# Patient Record
Sex: Female | Born: 1980 | Race: Black or African American | Hispanic: No | Marital: Married | Smoking: Never smoker
Health system: Southern US, Community
[De-identification: ages and names within clinical notes are randomized; demographics above are authoritative.]

## PROBLEM LIST (undated history)

## (undated) ENCOUNTER — Inpatient Hospital Stay (HOSPITAL_COMMUNITY): Payer: Self-pay

## (undated) DIAGNOSIS — D649 Anemia, unspecified: Secondary | ICD-10-CM

## (undated) DIAGNOSIS — K219 Gastro-esophageal reflux disease without esophagitis: Secondary | ICD-10-CM

## (undated) DIAGNOSIS — T7840XA Allergy, unspecified, initial encounter: Secondary | ICD-10-CM

## (undated) DIAGNOSIS — R51 Headache: Secondary | ICD-10-CM

## (undated) DIAGNOSIS — R011 Cardiac murmur, unspecified: Secondary | ICD-10-CM

## (undated) HISTORY — DX: Allergy, unspecified, initial encounter: T78.40XA

## (undated) HISTORY — DX: Gastro-esophageal reflux disease without esophagitis: K21.9

---

## 2004-12-29 ENCOUNTER — Inpatient Hospital Stay (HOSPITAL_COMMUNITY): Admission: AD | Admit: 2004-12-29 | Discharge: 2004-12-29 | Payer: Self-pay | Admitting: Family Medicine

## 2004-12-29 ENCOUNTER — Ambulatory Visit: Payer: Self-pay | Admitting: Family Medicine

## 2005-01-09 ENCOUNTER — Inpatient Hospital Stay (HOSPITAL_COMMUNITY): Admission: AD | Admit: 2005-01-09 | Discharge: 2005-01-09 | Payer: Self-pay | Admitting: Obstetrics & Gynecology

## 2005-01-09 ENCOUNTER — Ambulatory Visit: Payer: Self-pay | Admitting: *Deleted

## 2005-01-14 ENCOUNTER — Ambulatory Visit: Payer: Self-pay | Admitting: *Deleted

## 2005-01-20 ENCOUNTER — Ambulatory Visit (HOSPITAL_COMMUNITY): Admission: RE | Admit: 2005-01-20 | Discharge: 2005-01-20 | Payer: Self-pay | Admitting: Obstetrics & Gynecology

## 2005-01-23 ENCOUNTER — Inpatient Hospital Stay (HOSPITAL_COMMUNITY): Admission: AD | Admit: 2005-01-23 | Discharge: 2005-01-23 | Payer: Self-pay | Admitting: Obstetrics & Gynecology

## 2005-01-28 ENCOUNTER — Ambulatory Visit: Payer: Self-pay | Admitting: Family Medicine

## 2005-02-03 ENCOUNTER — Ambulatory Visit (HOSPITAL_COMMUNITY): Admission: RE | Admit: 2005-02-03 | Discharge: 2005-02-03 | Payer: Self-pay | Admitting: Obstetrics & Gynecology

## 2005-02-18 ENCOUNTER — Ambulatory Visit: Payer: Self-pay | Admitting: *Deleted

## 2005-02-25 ENCOUNTER — Inpatient Hospital Stay (HOSPITAL_COMMUNITY): Admission: AD | Admit: 2005-02-25 | Discharge: 2005-02-25 | Payer: Self-pay | Admitting: *Deleted

## 2005-02-27 ENCOUNTER — Ambulatory Visit: Payer: Self-pay | Admitting: *Deleted

## 2005-02-27 ENCOUNTER — Inpatient Hospital Stay (HOSPITAL_COMMUNITY): Admission: RE | Admit: 2005-02-27 | Discharge: 2005-03-02 | Payer: Self-pay | Admitting: Obstetrics & Gynecology

## 2005-11-19 ENCOUNTER — Ambulatory Visit: Payer: Self-pay | Admitting: Obstetrics & Gynecology

## 2006-02-05 ENCOUNTER — Ambulatory Visit: Payer: Self-pay | Admitting: Family Medicine

## 2006-04-29 ENCOUNTER — Ambulatory Visit: Payer: Self-pay | Admitting: Gynecology

## 2006-07-17 ENCOUNTER — Ambulatory Visit: Payer: Self-pay | Admitting: Obstetrics & Gynecology

## 2006-10-06 ENCOUNTER — Ambulatory Visit: Payer: Self-pay | Admitting: Obstetrics & Gynecology

## 2007-04-15 ENCOUNTER — Ambulatory Visit: Payer: Self-pay | Admitting: Obstetrics & Gynecology

## 2007-04-15 ENCOUNTER — Encounter: Payer: Self-pay | Admitting: Obstetrics & Gynecology

## 2007-09-30 ENCOUNTER — Ambulatory Visit: Payer: Self-pay | Admitting: *Deleted

## 2007-11-10 ENCOUNTER — Ambulatory Visit: Payer: Self-pay | Admitting: Obstetrics & Gynecology

## 2007-11-12 ENCOUNTER — Ambulatory Visit (HOSPITAL_COMMUNITY): Admission: RE | Admit: 2007-11-12 | Discharge: 2007-11-12 | Payer: Self-pay | Admitting: Gynecology

## 2008-01-14 ENCOUNTER — Emergency Department (HOSPITAL_COMMUNITY): Admission: EM | Admit: 2008-01-14 | Discharge: 2008-01-14 | Payer: Self-pay | Admitting: Family Medicine

## 2008-04-12 ENCOUNTER — Emergency Department (HOSPITAL_COMMUNITY): Admission: EM | Admit: 2008-04-12 | Discharge: 2008-04-12 | Payer: Self-pay | Admitting: Emergency Medicine

## 2008-10-10 ENCOUNTER — Emergency Department (HOSPITAL_COMMUNITY): Admission: EM | Admit: 2008-10-10 | Discharge: 2008-10-10 | Payer: Self-pay | Admitting: Emergency Medicine

## 2009-04-16 ENCOUNTER — Emergency Department (HOSPITAL_COMMUNITY): Admission: EM | Admit: 2009-04-16 | Discharge: 2009-04-17 | Payer: Self-pay | Admitting: Emergency Medicine

## 2009-06-08 ENCOUNTER — Encounter: Payer: Self-pay | Admitting: *Deleted

## 2009-06-14 ENCOUNTER — Encounter (INDEPENDENT_AMBULATORY_CARE_PROVIDER_SITE_OTHER): Payer: Self-pay | Admitting: *Deleted

## 2009-07-04 ENCOUNTER — Encounter: Payer: Self-pay | Admitting: *Deleted

## 2009-09-23 ENCOUNTER — Emergency Department (HOSPITAL_COMMUNITY): Admission: EM | Admit: 2009-09-23 | Discharge: 2009-09-23 | Payer: Self-pay | Admitting: Family Medicine

## 2010-01-11 ENCOUNTER — Telehealth: Payer: Self-pay | Admitting: *Deleted

## 2010-02-18 ENCOUNTER — Ambulatory Visit: Payer: Self-pay | Admitting: Obstetrics & Gynecology

## 2010-03-07 ENCOUNTER — Ambulatory Visit
Admission: RE | Admit: 2010-03-07 | Discharge: 2010-03-07 | Payer: Self-pay | Source: Home / Self Care | Attending: Obstetrics & Gynecology | Admitting: Obstetrics & Gynecology

## 2010-03-14 ENCOUNTER — Ambulatory Visit (HOSPITAL_COMMUNITY)
Admission: RE | Admit: 2010-03-14 | Discharge: 2010-03-14 | Payer: Self-pay | Source: Home / Self Care | Attending: Obstetrics and Gynecology | Admitting: Obstetrics and Gynecology

## 2010-03-22 ENCOUNTER — Ambulatory Visit: Admit: 2010-03-22 | Payer: Self-pay | Admitting: Obstetrics and Gynecology

## 2010-04-09 NOTE — Miscellaneous (Signed)
Summary: Not allowed to reschedule  Pt aware of appt today but still did not show.  Is not allowed to reschedule.  We will continue to see her children but she will need to find another PCP.  Dennison Nancy RN  July 04, 2009 4:09 PM

## 2010-04-09 NOTE — Miscellaneous (Signed)
Summary: Do Not Reschedule!  Pt missed NP appt.  Per [facility] policy is not allowed to reschedule.  Dennison Nancy RN  June 08, 2009 11:32 AM

## 2010-04-09 NOTE — Progress Notes (Signed)
Summary: phn msg  Phone Note Call from Patient Call back at Home Phone [phone]   Caller: Patient Summary of Call: pt is calling back to see if she can get an appt - pt states that she talked with someone about a month ago and was called back stating that she can make an appt - she is calling now b/c there was a death in her family.  told her that I would need to talk w/ supervisor Initial call taken by: De Nurse,  January 11, 2010 10:19 AM  Follow-up for Phone Call        I think I discussed this patient with Britta Mccreedy.  Told her I would give her another chance since her children are seen here.  If she no shows another NP appt she will not be allowed another chance. Follow-up by: Dennison Nancy RN,  January 13, 2010 8:56 PM

## 2010-04-09 NOTE — Miscellaneous (Signed)
Summary: Can schedule NP appt  Pt left a vm on 06/05/09 stating she needed to cancel her appt but she would call to reschedule, vm was not checked before her appt was noshowed, she may reschedule. Knox Royalty  June 14, 2009 3:19 PM

## 2010-04-10 ENCOUNTER — Encounter: Payer: Self-pay | Admitting: *Deleted

## 2010-05-25 LAB — POCT URINALYSIS DIP (DEVICE)
Bilirubin Urine: NEGATIVE
Glucose, UA: NEGATIVE mg/dL
Hgb urine dipstick: NEGATIVE
Ketones, ur: NEGATIVE mg/dL
Nitrite: NEGATIVE
Protein, ur: NEGATIVE mg/dL
Specific Gravity, Urine: 1.02 (ref 1.005–1.030)
Urobilinogen, UA: 0.2 mg/dL (ref 0.0–1.0)
pH: 6 (ref 5.0–8.0)

## 2010-05-25 LAB — HIV ANTIBODY (ROUTINE TESTING W REFLEX): HIV: NONREACTIVE

## 2010-06-15 LAB — CBC
HCT: 37.3 % (ref 36.0–46.0)
Hemoglobin: 12.5 g/dL (ref 12.0–15.0)
MCHC: 33.5 g/dL (ref 30.0–36.0)
MCV: 97 fL (ref 78.0–100.0)
Platelets: 137 10*3/uL — ABNORMAL LOW (ref 150–400)
RBC: 3.85 MIL/uL — ABNORMAL LOW (ref 3.87–5.11)
RDW: 13.4 % (ref 11.5–15.5)
WBC: 5.9 10*3/uL (ref 4.0–10.5)

## 2010-06-15 LAB — WET PREP, GENITAL
Trich, Wet Prep: NONE SEEN
WBC, Wet Prep HPF POC: NONE SEEN
Yeast Wet Prep HPF POC: NONE SEEN

## 2010-06-15 LAB — URINALYSIS, ROUTINE W REFLEX MICROSCOPIC
Bilirubin Urine: NEGATIVE
Glucose, UA: NEGATIVE mg/dL
Ketones, ur: NEGATIVE mg/dL
Leukocytes, UA: NEGATIVE
Nitrite: NEGATIVE
Protein, ur: NEGATIVE mg/dL
Specific Gravity, Urine: 1.012 (ref 1.005–1.030)
Urobilinogen, UA: 1 mg/dL (ref 0.0–1.0)
pH: 6.5 (ref 5.0–8.0)

## 2010-06-15 LAB — GC/CHLAMYDIA PROBE AMP, GENITAL
Chlamydia, DNA Probe: NEGATIVE
GC Probe Amp, Genital: NEGATIVE

## 2010-06-15 LAB — BASIC METABOLIC PANEL
BUN: 6 mg/dL (ref 6–23)
CO2: 28 mEq/L (ref 19–32)
Calcium: 9.4 mg/dL (ref 8.4–10.5)
Chloride: 108 mEq/L (ref 96–112)
Creatinine, Ser: 0.8 mg/dL (ref 0.4–1.2)
GFR calc Af Amer: >60 mL/min (ref >60)
GFR calc non Af Amer: >60 mL/min (ref >60)
Glucose, Bld: 130 mg/dL — ABNORMAL HIGH (ref 70–99)
Potassium: 3.9 mEq/L (ref 3.5–5.1)
Sodium: 140 mEq/L (ref 135–145)

## 2010-06-15 LAB — DIFFERENTIAL
Basophils Absolute: 0.1 10*3/uL (ref 0.0–0.1)
Basophils Relative: 1 % (ref 0–1)
Eosinophils Absolute: 0.1 10*3/uL (ref 0.0–0.7)
Eosinophils Relative: 1 % (ref 0–5)
Lymphocytes Relative: 33 % (ref 12–46)
Lymphs Abs: 2 10*3/uL (ref 0.7–4.0)
Monocytes Absolute: 0.4 10*3/uL (ref 0.1–1.0)
Monocytes Relative: 8 % (ref 3–12)
Neutro Abs: 3.4 10*3/uL (ref 1.7–7.7)
Neutrophils Relative %: 58 % (ref 43–77)

## 2010-06-15 LAB — POCT PREGNANCY, URINE: Preg Test, Ur: NEGATIVE

## 2010-06-15 LAB — URINE MICROSCOPIC-ADD ON

## 2010-06-25 LAB — GC/CHLAMYDIA PROBE AMP, GENITAL
Chlamydia, DNA Probe: NEGATIVE
GC Probe Amp, Genital: NEGATIVE

## 2010-06-25 LAB — POCT URINALYSIS DIP (DEVICE)
Bilirubin Urine: NEGATIVE
Glucose, UA: NEGATIVE mg/dL
Hgb urine dipstick: NEGATIVE
Ketones, ur: NEGATIVE mg/dL
Nitrite: NEGATIVE
Protein, ur: NEGATIVE mg/dL
Specific Gravity, Urine: 1.015 (ref 1.005–1.030)
Urobilinogen, UA: 0.2 mg/dL (ref 0.0–1.0)
pH: 8 (ref 5.0–8.0)

## 2010-06-25 LAB — WET PREP, GENITAL
Clue Cells Wet Prep HPF POC: NONE SEEN
Yeast Wet Prep HPF POC: NONE SEEN

## 2010-06-25 LAB — POCT PREGNANCY, URINE: Preg Test, Ur: NEGATIVE

## 2010-07-23 NOTE — Group Therapy Note (Signed)
[name], [name]               ACCOUNT NO.:  [id number]   MEDICAL RECORD NO.:  [id number]          PATIENT TYPE:  WOC   LOCATION:  [facility]                   FACILITY:  [facility]   PHYSICIAN:  Sid Falcon, CNM  DATE OF BIRTH:  [birth year]   DATE OF SERVICE:  11/10/2007                                  CLINIC NOTE   ADDENDUM:  The patient recently is reporting vaginal __________ for 2-3  days. When reviewing the past labs, clue cells were found on the wet  prep.  The patient was not treated at that time due to being  asymptomatic.  Today, the patient was given a prescription for  metronidazole 500 mg 1 p.o. b.i.d. x7 days and advised to not drink  alcohol while using medication.      Sid Falcon, CNM     WM/MEDQ  D:  11/10/2007  T:  11/11/2007  Job:  [id number]

## 2010-07-23 NOTE — Group Therapy Note (Signed)
[name], [name]:  [id number]   MEDICAL RECORD NO.:  [id number]          PATIENT TYPE:  WOC   LOCATION:  [facility]                   FACILITY:  [facility]   PHYSICIAN:  Karlton Lemon, MD      DATE OF BIRTH:  1981/01/08   DATE OF SERVICE:  09/30/2007                                  CLINIC NOTE   CHIEF COMPLAINT:  Birth control change and STD screening.   HISTORY OF PRESENT ILLNESS:  The patient is a 30 year old female, G4, P37-  1-2-2, presenting for STD screening and a change in her birth control.  The patient states she has been with her current sexual partner for the  past 6 months and is concerned that she may have contracted an STD.  She  normally uses condoms during intercourse with this gentleman but over  the last week, one of the condoms ripped while she was having  intercourse.  She would like to be tested for HIV, syphilis, gonorrhea,  chlamydia and trichomonas.  She also states that she has been taking  oral contraceptive pills but has been having trouble taking them daily.  She has used Depo in the past.  She desires to have a Mirena IUD.   PAST MEDICAL HISTORY:  Asthma.   OB HISTORY:  She is a gravida 4, para 1-1-2-2.  History of 2 previous  cesarean sections.   GYNECOLOGIC HISTORY:  The patient has recent Pap smear that was negative  for intraepithelial lesion on the past two occasions here in clinic, the  most recent on April 15, 2007.   REVIEW OF SYSTEMS:  As in history of present illness, otherwise negative  for abdominal pain, chest pain, shortness of breath, vaginal discharge.   PHYSICAL EXAMINATION:  GENERAL:  Well-appearing in no acute distress.  VITAL SIGNS:  Temperature 99.2, pulse 83, respiratory rate 20,  respiratory rate 119/83, weight 140.7 pounds.  ABDOMEN:  Soft, nontender.  GENITOURINARY:  Normal female external genitalia.  Vaginal mucosa is  pink and moist.  There is a whitish creamy discharge.  The cervix is  midline  without any discharge or bleeding.  Cultures are collected.  On  bimanual exam, the uterus is normal size, nontender.  Adnexa are without  mass or tenderness.   ASSESSMENT AND PLAN:  A 30 year old gravida 4, para 1-1-2-2 presenting  for contraceptive management and STD testing.  1. Will check for sexually transmitted diseases with GC, chlamydia      cervical culture, wet prep, RPR and  HIV.  2. The patient is to follow up in two weeks for a Mirena IUD      placement.  We will follow the gonorrhea and chlamydia cultures      today.           ______________________________  Karlton Lemon, MD     NS/MEDQ  D:  09/30/2007  T:  09/30/2007  Job:  [id number]

## 2010-07-23 NOTE — Group Therapy Note (Signed)
NAMEMARIBELLA, [name]               ACCOUNT NO.:  [id number]   MEDICAL RECORD NO.:  [id number]          PATIENT TYPE:  WOC   LOCATION:  [facility]                   FACILITY:  [facility]   PHYSICIAN:  Sid Falcon, CNM  DATE OF BIRTH:  April 29, 1980   DATE OF SERVICE:  11/10/2007                                  CLINIC NOTE   Seen at the [facility] on November 10, 2007, for IUD  insertion.   The patient is here with a last menstrual period noted on October 21, 2007.  Last Pap smear was in February 2009.  It was negative for  lesions.  Last Chlamydia and gonorrhea screen on September 30, 2007, both  were negative.  Upon exam, the patient's uterus was assessed and it was  retroverted and small mobile.  No dominant mass was palpated.  Uterus  was sounded to 6 cm.  IUD was inserted.  Stenotic os retracted.  Dr.  Penne Lash called to the room for further insertion.  It sounded again to 6  cm.  IUD was inserted without difficulty.  Strings were cut at  approximately 3 cm due to the patient's uterus sounding so small at 6  cm.  The patient will be sent for a pelvic ultrasound just to check  placement.  Ultrasound was scheduled for November 12, 2007, at 8:45 a.m.  The patient is to follow up in 6 weeks for IUD check.  Review with the  patient again the common side effects during the first 6 months of IUD  to report any concerns or issues.  An urine pregnancy test today  negative.      Sid Falcon, CNM     WM/MEDQ  D:  11/10/2007  T:  11/11/2007  Job:  [id number]

## 2010-07-23 NOTE — Group Therapy Note (Signed)
NAMEGURNEET, [name]:  [id number]   MEDICAL RECORD NO.:  [id number]          PATIENT TYPE:  WOC   [facility]ATION:  [facility]                   FACILITY:  [facility]   PHYSICIAN:  Johnella Moloney, MD        DATE OF BIRTH:  1980-11-05   DATE OF SERVICE:  04/15/2007                                  CLINIC NOTE   HPI:  This is a 30 year old female here for her yearly Pap smear.  She  also complains of abnormal bleeding since being on a Depo shot.  She  states that her last Depo was September 17, 2006, but has had abnormal  bleeding off and on since then.  She states that she, while on the Depo,  would get her periods twice a month of very heavy bleeding and lasted 5  days.  Since December, when she believes the Depo effects had worn off,  she states that the bleeding is much lighter; however, she still  continues to have it off and on sometimes for 5 days, sometimes fewer  days.  She denies cramping at this time.  She is sexually active and  does use condoms though she said that about 1-1/2 months ago the condom  had come off.  She does want full STD testing today.  She does not  believe she is pregnant.  She desires pills for birth control only as  she does not want anything inserted in to her.  The patch she does not  want either.   PAST MEDICAL HISTORY:  Asthma.   GYN HISTORY:  She is a G4, P2.  She had an abnormal Pap smear  approximately 5 years ago and went to colpo.  She states her Pap smears  have been normal since.  She had normal periods prior to being on Depo  and had been on Ortho Tri-Cyclen in the past, however, got pregnant on  it.   REVIEW OF SYSTEMS:  As in HPI with the exception of positive for  headaches on occasion, do not limit her daily activity.  Negative for  chest pain, trouble breathing, coughing, night sweats, some abdominal  pain, diarrhea.  Of note, on review of systems, she does complain of  vaginal discharge whitish in color, denies itching, denies  burning.   PHYSICAL EXAM:  Blood pressure 109/77.  Weight 136 pounds.  Pulse of 70.  Temperature is 98.4.  Respiratory rate 20.  GENERAL EXAM:  Not in acute distress.  CARDIOVASCULAR:  Regular rate and rhythm.  No rubs, gallops, or murmurs.  LUNGS:  Clear to auscultation bilaterally.  ABDOMEN:  Soft, nontender.  Positive bowel sounds.  SKIN:  No rashes.  EXTREMITIES:  Two-plus pulses.  No edema.  GU EXAM:  Cervix was without visible lesions; however, it is friable.  No masses palpated on bimanual exam.   ASSESSMENT AND PLAN:  A 30 year old female here for annual Pap and  complains of abnormal bleeding.  1. Annual exam:  We did obtain a Pap smear and we will follow the      results.  Also, given risk and potential exposure of  sexually      transmitted disease.  The patient was offered and accepted the      offer of sexually transmitted disease screening including HIV and      RPR.  Patient was counseled on safe sex.  2. Abnormal bleeding:  Given the young age and the timing that      coincides with Depo, we will try the patient on Sprintec oral      contraceptive pills daily.  Patient may follow up as needed if it      does not correct the problem.  3. Vaginal discharge:  On wet prep, Trichomonas was visible.  A      prescription for Flagyl was written and given to      the patient.  Patient will have her partner treated as well.  All      other sexually transmitted disease exams were pending.      Johney Maine, MD    ______________________________  Johnella Moloney, MD    JT/MEDQ  D:  04/15/2007  T:  04/16/2007  Job:  [id number]

## 2010-07-26 NOTE — Op Note (Signed)
NAMEKRINA, [name]               ACCOUNT NO.:  [id number]   MEDICAL RECORD NO.:  [id number]          PATIENT TYPE:  INP   LOCATION:  9127                          [facility]   PHYSICIAN:  Conni Elliot, M.D.DATE OF BIRTH:  01-11-1981   DATE OF PROCEDURE:  DATE OF DISCHARGE:                                 OPERATIVE REPORT   PREOPERATIVE DIAGNOSES:  1.  Forty week and four day intrauterine pregnancy.  2.  Previous cesarean section.  Desires repeat.   POSTOPERATIVE DIAGNOSES:  1.  Forty week and four day intrauterine pregnancy.  2.  Previous cesarean section.  Desires repeat.   OPERATION/PROCEDURE:  Elect repeat low transverse cesarean section via  Pfannenstiel incision.   SURGEON:  Conni Elliot, M.D.   ASSISTANTKaroline Caldwell B. Merlene Morse, M.D.   ANESTHESIA:  Spinal.   COMPLICATIONS:  None.   ESTIMATED BLOOD LOSS:  500 mL.   FLUIDS REPLACED:  4700 mL.   URINARY OUTPUT:  250 mL at the end of the procedure.   INDICATIONS:  This is a 30 year old gravida 4, para 0-1-2-1, at 40-4/7 weeks  with history of a previous cesarean section for breech.  The patient desired  repeat cesarean section.   FINDINGS:  Female infant in cephalic presentation.  __________ present, Apgars  9 and 9.  Weight 8 pounds 9 ounces.  Normal uterus, tubes and ovaries.   DESCRIPTION OF PROCEDURE:  The patient was taken to the operating room.  Spinal anesthesia was found to be adequate.  She was then prepped and draped  in the normal sterile fashion in the dorsal supine position with a leftward  tilt.  A Pfannenstiel skin incision was then made with the scalpel and the  previous scar was removed.  The incision was carried through the underlying  layer of fascia.  The fascia was excised in the midline and incision  extended laterally with the Mayo scissors.  The superior aspect of the  fascial incision was then grasped with the Kocher clamps, elevated and the  underlying rectus muscle dissected  off bluntly.  Rectus muscle was then  separated in the midline.  Peritoneum identified, tented up and entered  sharply with the Metzenbaum scissors.  The peritoneal incision was extended  superiorly and inferiorly with good visualization of the bladder.  Bladder  blade was then inserted into the vesicouterine peritoneum, identified and  grasped with pickups and entered sharply with the Metzenbaum scissors.  The  bladder flap created digitally.  The bladder blade was then reinserted in  the lower uterine segment, incised in the transverse fashion with the  scalpel.  The uterus was then extended laterally.  Bladder blade was removed  and the infant's head delivered atraumatically.  The nose and mouth were  suctioned with the bulb syringe and the cord was clamped and cut.  The baby  had a body cord which was delivered through, was handed off to the awaiting  pediatrician.  Cord blood sent.  The placenta was then removed  spontaneously.  The uterus was exteriorized and cleared of all clots and  debris.  The uterine incision was repaired in a running locked fashion.  A  second layer of the same suture was used to imbricate with excellent  hemostasis.  The bladder flap was repaired with a running stitch and uterus  returned to the abdomen.  The gutters were cleared off of all clots and  peritoneum closed.  The fascia was reapproximated in running fashion.  Subcutaneous tissue was reapproximated and the skin was closed with staples.  The patient tolerated the procedure well.  Sponge, lap and needle counts  were correct x2.  One gram of Ancef was given at cord clamp.  The patient  was taken to the recovery room in stable condition.     ______________________________  August Saucer Merlene Morse, MD    ______________________________  Conni Elliot, M.D.    ABC/MEDQ  D:  02/27/2005  T:  02/28/2005  Job:  [id number]

## 2010-07-26 NOTE — Discharge Summary (Signed)
[name], [name]               ACCOUNT NO.:  [id number]   MEDICAL RECORD NO.:  [id number]          PATIENT TYPE:  INP   LOCATION:  9127                          [facility]   PHYSICIAN:  Conni Elliot, M.D.DATE OF BIRTH:  04/26/1980   DATE OF ADMISSION:  02/27/2005  DATE OF DISCHARGE:  03/02/2005                                 DISCHARGE SUMMARY   HISTORY:  The patient is a 30 year old, gravida 3, para 1-0-0-1, with a  previous cesarean section for breech, presented at term, scheduled for a  repeat low transverse cesarean section.  She was having no complaints, mild  contractions, good fetal movement.  Her prenatal course was complicated by  anemia and sickle cell trait.  Her dating was by 32-week ultrasound as her  LMP was irregular.  Of note the ultrasound due date was February 23, 2005  making her 96 and 4 by best estimated gestational age.   ALLERGIES:  She had no drug allergies.   MEDICATIONS:  Prenatal vitamins.   OB HISTORY:  She had a first trimester TAB, a second trimester early SAB and  in 2003 had a transverse cesarean section at 36-3/7 weeks for breech and  breech in labor.   FAMILY HISTORY:  Of note, her family history is significant for a daughter  with VSD, DORV.   PAST SURGICAL HISTORY:  Cesarean section.   PHYSICAL EXAMINATION:  VITAL SIGNS:  She was afebrile with BP 104/70.  GENERAL:  She was in no distress.  NECK:  Trachea midline.  LUNGS:  Clear.  HEART:  Normal S1 and S2 without murmur.  BREASTS: Deferred  ABDOMEN:  Term size, gravid.  GENITALIA:  Deferred.  EXTREMITIES:  No edema.   LABORATORY DATA:  Her blood type is O positive, platelets at baseline 142.  Hemoglobin 10.4.  Rubella immune.  Hepatitis B surface antigen negative.  HIV apparently declined.   HOSPITAL COURSE:  The patient was admitted on December 21 and taken to the  OR.  She had an uncomplicated cesarean section, delivered a female infant that  was viable.  Estimated blood  loss was 500.  There were no complications.  Her postoperative course was benign.  Check incision was done.  She was  breast feeding.  She did have some mild itching, treated with Benadryl.  Her  hemoglobin was 10.1 on December 22, 2006and her platelets which had been 142  were 111 at the time of discharge.  She was requiring very little analgesia.  On postoperative day #3 she was ambulatory without orthostatic symptoms,  voiding q.s., breast feeding well.  She planned to continue breast feeding  without supplementation and __________  Depo-Provera at six weeks.  Her  vital signs were stable except platelets as noted above.  Breasts soft,  lungs clear.  Heart regular. Abdomen nontender with incision clean, dry and  intact with the staples in.  Fundus was involuting.  She was recovering well  with mild anemia and mild  thrombocytopenia.  She was discharged with followup at six weeks at [facility] to get her Depo-Provera shot then.  She will use condoms until then.  She was given prescriptions for Percocet, ibuprofen and prenatal vitamins.  She was also to get repeat CBC with platelets at her six week check up.  Staples were removed predischarge.      Caren Griffins, C.N.M.    ______________________________  Conni Elliot, M.D.    DP/MEDQ  D:  05/04/2005  T:  05/05/2005  Job:  [id number]

## 2010-07-26 NOTE — Group Therapy Note (Signed)
NAMECASEY, [name]:  [id number]   MEDICAL RECORD NO.:  [id number]          PATIENT TYPE:  WOC   LOCATION:  [facility]                   FACILITY:  [facility]   PHYSICIAN:  Deirdre Christy Gentles, CNM       DATE OF BIRTH:  12-Nov-1980   DATE OF SERVICE:  11/19/2005                                    CLINIC NOTE   This is a 30 year old P1-1-2-2 who is now 8 months post repeat C-section who  is coming today to initiate contraceptives.  She has not been seen since  having her baby.  She understands all her options for contraception and  would like to have Depo-Provera.  Her LMP was November 11, 2005 and was  normal.  She has never had an abnormal Pap smear.  The last one was done  over a year ago.  She is using no contraception and declines STD testing.  Has mutually monogamous partner.  She has no drug allergies.  Is allergic to  cats and pollen and shrimp.  She is on no medications.   Medical history is significant for asthma.  She had a moderately severe  attack about 3 weeks ago and this resolved with albuterol.  She has since  then used albuterol occasionally.  She would like to have an Advair  prescription as this is what had helped her in the past since she has had  asthma attacks.  She is concerned that the pollen season is coming up.  Denies any wheezing or shortness of breath at this time.  Other medical  issues is that she had borderline thrombocytopenia during her pregnancy and  that she is hemoglobin AS.   SURGERIES:  C-sections x2.   SOCIAL HISTORY:  Negative tobacco, alcohol or drugs.  Both her children are  thriving.  She is not working outside the home.   EXAMINATION:  VITAL SIGNS:  Pulse 68, BP 116/73, weight 130.  HEART:  RRR without murmur.  LUNGS:  Clear to auscultation.  NECK:  Thyroid nonpalpable.  ABDOMEN:  Soft, nontender, no organomegaly.  PELVIC:  NAFT.  Vagina:  Good rugae.  Cervix:  Clean, also good tone and  support.  Uterus:  NSSP.  Adnexa:   No tenderness or masses.   ASSESSMENT:  1. Normal GYN.  Needs contraception.  2. Asthma, fair control with albuterol.  3. Mild thrombocytopenia of pregnancy.   PLAN:  1. Pap and wet prep are done today.  2. She will be given her Depo-Provera 150 mg IM if her urine pregnancy      test is negative.  3. She is also given a prescription for Advair HFA to take 1-2 puffs      b.i.d. when needed.  4. She will be following up in 3 months for a Depo-Provera shot.           ______________________________  Caren Griffins, CNM     DP/MEDQ  D:  11/19/2005  T:  11/20/2005  Job:  [id number]

## 2010-11-29 LAB — POCT PREGNANCY, URINE
Operator id: 297281
Preg Test, Ur: NEGATIVE

## 2010-12-10 LAB — POCT URINALYSIS DIP (DEVICE)
Glucose, UA: NEGATIVE mg/dL
Nitrite: NEGATIVE
Operator id: 239701
Urobilinogen, UA: 2 mg/dL — ABNORMAL HIGH (ref 0.0–1.0)

## 2010-12-10 LAB — POCT PREGNANCY, URINE: Preg Test, Ur: NEGATIVE

## 2010-12-11 LAB — POCT PREGNANCY, URINE: Preg Test, Ur: NEGATIVE

## 2011-01-17 DIAGNOSIS — N949 Unspecified condition associated with female genital organs and menstrual cycle: Secondary | ICD-10-CM | POA: Insufficient documentation

## 2011-01-17 DIAGNOSIS — J45909 Unspecified asthma, uncomplicated: Secondary | ICD-10-CM | POA: Insufficient documentation

## 2011-01-18 ENCOUNTER — Encounter: Payer: Self-pay | Admitting: *Deleted

## 2011-01-18 ENCOUNTER — Emergency Department (HOSPITAL_COMMUNITY)
Admission: EM | Admit: 2011-01-18 | Discharge: 2011-01-18 | Disposition: A | Discharge status: Home / Self Care | Payer: Medicaid Other | Attending: Emergency Medicine | Admitting: Emergency Medicine

## 2011-01-18 DIAGNOSIS — R102 Pelvic and perineal pain: Secondary | ICD-10-CM

## 2011-01-18 LAB — URINALYSIS, ROUTINE W REFLEX MICROSCOPIC
Glucose, UA: NEGATIVE mg/dL
Ketones, ur: NEGATIVE mg/dL
Leukocytes, UA: NEGATIVE
Nitrite: NEGATIVE
Protein, ur: NEGATIVE mg/dL
Urobilinogen, UA: 1 mg/dL (ref 0.0–1.0)

## 2011-01-18 LAB — POCT PREGNANCY, URINE: Preg Test, Ur: NEGATIVE

## 2011-01-18 MED ORDER — ACETAMINOPHEN 325 MG PO TABS
650.0000 mg | ORAL_TABLET | Freq: Once | ORAL | Status: AC
  Administered 2011-01-18: 650 mg via ORAL
  Filled 2011-01-18: qty 2

## 2011-01-18 NOTE — ED Notes (Signed)
Pt refused pelvic exam.  MD notified.  MD and this RN explained benefits of pelvic exam and pt still refuses.  D/c home with paperwork, no rx given.  Pt voiced understanding to f/u with OBGYN

## 2011-01-18 NOTE — ED Notes (Signed)
C/o pelvic pain, has had merina in for ~ 4 yrs, describes pain as frequent and recurrent, 2-3x per month, [facility], also mentions ovarian cyst, also reports nausea at 2300 (resolved), (denies: fever, vd, urinary or vaginal d/c), mentions bleeding ~ 3d ago (light & stopped).

## 2011-01-18 NOTE — ED Provider Notes (Signed)
History     CSN: [id number] Arrival date & time: 01/18/2011  1:14 AM   First MD Initiated Contact with Patient 01/18/11 0243      Chief Complaint  Patient presents with  . Pelvic Pain    (Consider location/radiation/quality/duration/timing/severity/associated sxs/prior treatment) Patient is a 30 y.o. female presenting with pelvic pain.  Pelvic Pain  The patient is a 30 year old female who presents today complaining of her chronic pelvic pain. She reports that she's had this for years. She states that the pain is back again today. She does not suspect that she could be pregnant. She denies any nausea, vomiting, or fevers. Patient has not had any medications to take for this. She has planned followup with the [facility] for this. She denies any dysuria or increased urinary frequency. She's had no nausea or vomiting. There are no other associated or modifying factors.  Past Medical History  Diagnosis Date  . Asthma     Past Surgical History  Procedure Date  . Cesarean section     Family History  Problem Relation Age of Onset  . Diabetes Other   . Cancer Other     History  Substance Use Topics  . Smoking status: Never Smoker   . Smokeless tobacco: Not on file  . Alcohol Use: No    OB History    Grav Para Term Preterm Abortions TAB SAB Ect Mult Living                  Review of Systems  Constitutional: Negative.   HENT: Negative.   Eyes: Negative.   Respiratory: Negative.   Cardiovascular: Negative.   Gastrointestinal: Negative.   Genitourinary: Positive for pelvic pain.  Musculoskeletal: Negative.   Skin: Negative.   Neurological: Negative.   Hematological: Negative.   Psychiatric/Behavioral: Negative.   All other systems reviewed and are negative.    Allergies  Shrimp and Iodinated diagnostic agents  Home Medications   Current Outpatient Rx  Name Route Sig Dispense Refill  . ALBUTEROL SULFATE HFA 108 (90 BASE) MCG/ACT IN AERS Inhalation  Inhale 2 puffs into the lungs every 6 (six) hours as needed. Asthma     . LEVONORGESTREL 20 MCG/24HR IU IUD Intrauterine 1 each by Intrauterine route once.      . CENTRUM PO Oral Take 1 tablet by mouth daily.        BP 116/74  Pulse 60  Temp(Src) 98.6 F (37 C) (Oral)  Resp 18  SpO2 100%  Physical Exam  Nursing note and vitals reviewed. Constitutional: She is oriented to person, place, and time. She appears well-developed and well-nourished. No distress.  HENT:  Head: Normocephalic and atraumatic.  Eyes: Conjunctivae and EOM are normal. Pupils are equal, round, and reactive to light.  Neck: Normal range of motion.  Cardiovascular: Normal rate, regular rhythm, normal heart sounds and intact distal pulses.  Exam reveals no gallop and no friction rub.   No murmur heard. Pulmonary/Chest: Effort normal and breath sounds normal. No respiratory distress. She has no wheezes. She has no rales.  Abdominal: Soft. Bowel sounds are normal. She exhibits no distension. There is no tenderness. There is no rebound and no guarding.  Genitourinary:       Patient refused  Neurological: She is alert and oriented to person, place, and time.  Skin: Skin is warm. No rash noted.  Psychiatric: She has a normal mood and affect.    ED Course  Procedures (including critical care time)  Labs Reviewed  URINALYSIS, ROUTINE W REFLEX MICROSCOPIC  POCT PREGNANCY, URINE  LAB REPORT - SCANNED   No results found.   1. Pelvic pain in female       MDM  The patient presented today for evaluation of her pelvic pain. At the time of my evaluation patient had urinalysis and urine pregnancy performed. These have both been within normal limits. Patient had normal vital signs and was nontoxic in appearance. She did appear disgruntled. Patient refused pelvic exam she stated that she was going to have to have one of these at her upcoming OB/GYN appointment and that she found it very uncomfortable and opted not to  have this. I told patient that I would be unable to provide her with anything stronger than a dose of Tylenol without further evaluation. Patient was given 650 mg of Tylenol by mouth. She continued to opt not to have pelvic exam. Patient was discharged and told that she was welcome to return at any time. When asked if there is anything else I could do for the patient today the patient reported that there was not. She will followup with her OB/GYN regarding this chronic issue.        Cyndra Numbers, MD 01/18/11 [phone]

## 2011-02-07 ENCOUNTER — Telehealth: Payer: Self-pay | Admitting: *Deleted

## 2011-02-07 NOTE — Telephone Encounter (Signed)
Pt left message stating that she wants appt to have IUD removed. She is having more frequent pain. She was told that next available appt is end of month and does not want to wait that long.  If we cannot give her a sooner appt, she would like a referral to someone else that can remove the IUD sooner. I called pt and left message that I am returning her call.

## 2011-02-12 ENCOUNTER — Encounter: Payer: Self-pay | Admitting: Obstetrics & Gynecology

## 2011-02-12 ENCOUNTER — Telehealth: Payer: Self-pay | Admitting: *Deleted

## 2011-02-12 ENCOUNTER — Ambulatory Visit (INDEPENDENT_AMBULATORY_CARE_PROVIDER_SITE_OTHER): Payer: Medicaid Other | Admitting: Obstetrics & Gynecology

## 2011-02-12 VITALS — BP 116/72 | HR 69 | Temp 97.8°F | Ht 68.5 in | Wt 131.8 lb

## 2011-02-12 DIAGNOSIS — Z23 Encounter for immunization: Secondary | ICD-10-CM

## 2011-02-12 DIAGNOSIS — Z30432 Encounter for removal of intrauterine contraceptive device: Secondary | ICD-10-CM

## 2011-02-12 DIAGNOSIS — Z975 Presence of (intrauterine) contraceptive device: Secondary | ICD-10-CM

## 2011-02-12 MED ORDER — INFLUENZA VIRUS VACC SPLIT PF IM SUSP
0.5000 mL | Freq: Once | INTRAMUSCULAR | Status: AC
  Administered 2011-02-12: 0.5 mL via INTRAMUSCULAR

## 2011-02-12 NOTE — Progress Notes (Signed)
  Subjective:    Patient ID: [name], female    DOB: November 08, 1980, 29 y.o.   MRN: [id number]  HPI [name] is [id number] who wants her IUD removed. She complains of pelvic pain and is also considering a pregnancy.    Review of Systems     Objective:   Physical Exam  Her IUD strings were very short but I was able to grasp them and the IUD was removed intact without difficulty.      Assessment & Plan:  Fertility restored. I have counseled her about folic acid and the need to use condoms for 6 weeks in order to get the benefit of the folic acid. She should come back in 1 month for a annual exam/pap smear. She wants a flu shot today.

## 2011-02-12 NOTE — Telephone Encounter (Signed)
Patient called again to front desk and call referred to nurse- patient requesting appointment for IUD removal because it has been uncomfortable since it was put in, but now is getting increasingly more painful Informed pt. We had a cancelation today at 2:30- patient is able to accept that appointment and will be here today.

## 2011-02-20 NOTE — Telephone Encounter (Signed)
erronious encounter

## 2011-03-17 ENCOUNTER — Other Ambulatory Visit (HOSPITAL_COMMUNITY)
Admission: RE | Admit: 2011-03-17 | Discharge: 2011-03-17 | Disposition: A | Discharge status: Home / Self Care | Payer: Medicaid Other | Source: Ambulatory Visit | Attending: Obstetrics and Gynecology | Admitting: Obstetrics and Gynecology

## 2011-03-17 ENCOUNTER — Ambulatory Visit (INDEPENDENT_AMBULATORY_CARE_PROVIDER_SITE_OTHER): Payer: Medicaid Other | Admitting: Obstetrics and Gynecology

## 2011-03-17 ENCOUNTER — Encounter: Payer: Self-pay | Admitting: Obstetrics and Gynecology

## 2011-03-17 DIAGNOSIS — Z3200 Encounter for pregnancy test, result unknown: Secondary | ICD-10-CM

## 2011-03-17 DIAGNOSIS — Z01419 Encounter for gynecological examination (general) (routine) without abnormal findings: Secondary | ICD-10-CM | POA: Insufficient documentation

## 2011-03-17 NOTE — Patient Instructions (Signed)
Safer Sex Your caregiver wants you to have this information about the infections that can be transmitted from sexual contact and how to prevent them. The idea behind safer sex is that you can be sexually active, and at the same time reduce the risk of giving or getting a sexually transmitted disease (STD). Every person should be aware of how to prevent him or herself and his or her sex partner from getting an STD. CAUSES OF STDS STDs are transmitted by sharing body fluids, which contain viruses and bacteria. The following fluids all transmit infections during sexual intercourse and sex acts:  Semen.   Saliva.   Urine.   Blood.   Vaginal mucus.  Examples of STDs include:  Chlamydia.   Gonorrhea.   Genital herpes.   Hepatitis B.   Human immunodeficiency virus or acquired immunodeficiency syndrome (HIV or AIDS).   Syphilis.   Trichomonas.   Pubic lice.   Human papillomavirus (HPV), which may include:   Genital warts.   Cervical dysplasia.   Cervical cancer (can develop with certain types of HPV).  SYMPTOMS  Sexual diseases often cause few or no symptoms until they are advanced, so a person can be infected and spread the infection without knowing it. Some STDs respond to treatment very well. Others, like HIV and herpes, cannot be cured, but are treated to reduce their effects. Specific symptoms include:  Abnormal vaginal discharge.   Irritation or itching in and around the vagina, and in the pubic hair.   Pain during sexual intercourse.   Bleeding during sexual intercourse.   Pelvic or abdominal pain.   Fever.   Growths in and around the vagina.   An ulcer in or around the vagina.   Swollen glands in the groin area.  DIAGNOSIS   Blood tests.   Pap test.   Culture test of abnormal vaginal discharge.   A test that applies a solution and examines the cervix with a lighted magnifying scope (colposcopy).   A test that examines the pelvis with a lighted  tube, through a small incision (laparoscopy).  TREATMENT  The treatment will depend on the cause of the STD.  Antibiotic treatment by injection, oral, creams, or suppositories in the vagina.   Over-the-counter medicated shampoo, to get rid of pubic lice.   Removing or treating growths with medicine, freezing, burning (electrocautery), or surgery.   Surgery treatment for HPV of the cervix.   Supportive medicines for herpes, HIV, AIDS, and hepatitis.  Being careful cannot eliminate all risk of infection, but sex can be made much safer. Safe sexual practices include body massage and gentle touching. Masturbation is safe, as long as body fluids do not contact skin that has sores or cuts. Dry kissing and oral sex on a man wearing a latex condom or on a woman wearing a female condom is also safe. Slightly less safe is intercourse while the man wears a latex condom or wet kissing. It is also safer to have one sex partner that you know is not having sex with anyone else. LENGTH OF ILLNESS An STD might be treated and cured in a week, sometimes a month, or more. And it can linger with symptoms for many years. STDs can also cause damage to the female organs. This can cause chronic pain, infertility, and recurrence of the STD, especially herpes, hepatitis, HIV, and HPV. HOME CARE INSTRUCTIONS AND PREVENTION  Alcohol and recreational drugs are often the reason given for not practicing safer sex. These substances affect   your judgment. Alcohol and recreational drugs can also impair your immune system, making you more vulnerable to disease.   Do not engage in risky and dangerous sexual practices, including:   Vaginal or anal sex without a condom.   Oral sex on a man without a condom.   Oral sex on a woman without a female condom.   Using saliva to lubricate a condom.   Any other sexual contact in which body fluids or blood from one partner contact the other partner.   You should use only latex  condoms for men and water soluble lubricants. Petroleum based lubricants or oils used to lubricate a condom will weaken the condom and increase the chance that it will break.   Think very carefully before having sex with anyone who is high risk for STDs and HIV. This includes IV drug users, people with multiple sexual partners, or people who have had an STD, or a positive hepatitis or HIV blood test.   Remember that even if your partner has had only one previous partner, their previous partner might have had multiple partners. If so, you are at high risk of being exposed to an STD. You and your sex partner should be the only sex partners with each other, with no one else involved.   A vaccine is available for hepatitis B and HPV through your caregiver or the Public Health Department. Everyone should be vaccinated with these vaccines.   Avoid risky sex practices. Sex acts that can break the skin make you more likely to get an STD.  SEEK MEDICAL CARE IF:   If you think you have an STD, even if you do not have any symptoms. Contact your caregiver for evaluation and treatment, if needed.   You think or know your sex partner has acquired an STD.   You have any of the symptoms mentioned above.  Document Released: 04/03/2004 Document Revised: 11/06/2010 Document Reviewed: 01/24/2009 Exit[employer] Patient Information 2012 [employer]. 

## 2011-03-17 NOTE — Progress Notes (Signed)
Pt requested pregnancy test as she is concerned that she may be pregnant since she has not used any protection since having her mirena removed.

## 2011-03-17 NOTE — Progress Notes (Addendum)
[name] y.o.No obstetric history on file. @Unknown  Chief Complaint  Patient presents with  . Gynecologic Exam    SUBJECTIVE  HPI: Presents for annual GYN exam with Pap. She's had her Mirena or removed 2 months ago and has had one menstrual period which was moderate flow x 2 days about 4 weeks ago. She is desirous of conceiving. She is having some relationship problems and problems with family members who are ill. She's not been eating and has little appetite. She is also having insomnia. She denies emotional lability or ideation.  Requests GC/CT with Pap. OB/GYN history She's had 2 C-sections and has an 29 year old and six-year-old. Her partner has nonbiological children. Pap smears have all been negative except once 5-10 years ago when she had an abnormal and underwent colposcopy. Last Pap was about 2 years ago. Denies history of STI's.  Preventive health Uses seatbelts. Not currently doing any regular exercise. Negative for transfusions, positive for tattoos. Denies domestic violence.  Past Medical History  Diagnosis Date  . Asthma   Uses Proventil inhalerseveral times a week Past Surgical History  Procedure Date  . Cesarean section    History   Social History  . Marital Status: Single    Spouse Name: N/A    Number of Children: N/A  . Years of Education: N/A   Occupational History  . Not on file.   Social History Main Topics  . Smoking status: Never Smoker   . Smokeless tobacco: Not on file  . Alcohol Use: No  . Drug Use: No  . Sexually Active: Yes    Birth Control/ Protection: Other-see comments     merina    Other Topics Concern  . Not on file   Social History Narrative  . No narrative on file   Current Outpatient Prescriptions on File Prior to Visit  Medication Sig Dispense Refill  . albuterol (PROVENTIL HFA;VENTOLIN HFA) 108 (90 BASE) MCG/ACT inhaler Inhale 2 puffs into the lungs every 6 (six) hours as needed. Asthma       . Multiple  Vitamins-Minerals (CENTRUM PO) Take 1 tablet by mouth daily.         Allergies  Allergen Reactions  . Shrimp (Shellfish Allergy) Anaphylaxis  . Iodinated Diagnostic Agents     Unknown   ROS: pertinent items in history of present illness  OBJECTIVE  BP 114/72  Pulse 92  Temp(Src) 97.7 F (36.5 C) (Oral)  Ht 5\' 9"  (1.753 m)  Wt 132 lb 12.8 oz (60.238 kg)  BMI 19.61 kg/m2  LMP 03/14/2011  Physical Exam  Constitutional: She is well-developed, well-nourished, and in no distress.  HENT:  Head: Normocephalic and atraumatic.  Eyes: EOM are normal.  Neck: Neck supple. No thyromegaly present.  Cardiovascular: Normal rate, regular rhythm and normal heart sounds.   Pulmonary/Chest: Effort normal and breath sounds normal. No respiratory distress.  Abdominal: Soft. There is no tenderness.  Genitourinary: Vagina normal, uterus normal, cervix normal, right adnexa normal and left adnexa normal. No vaginal discharge found.       Cervix without lesions. No CMP. Uterus retroverted, NT. No adnexal tenderness   Results for orders placed in visit on 03/17/11 (from the past 24 hour(s))  POCT PREGNANCY, URINE     Status: Normal   Collection Time   03/17/11  1:10 PM      Component Value Range   Preg Test, Ur NEGATIVE      ASSESSMENT  Healthy female exam with mild situational depression present  PLAN A support system. Advised her to call [facility] if she feels her depression is worsening. Discussed proper diet and exercise. Followup depended on Pap smear result Addendum: Pt called asking for antidepressant. Will call her back and discuss. [name] 03/28/2011 11:06 AM  Spoke with patient and she is felling better and has appointment at [facility] for counseling. Gave her Pap result. [name]

## 2011-03-17 NOTE — Progress Notes (Signed)
Addended by: Lynnell Dike on: 03/17/2011 04:15 PM   Modules accepted: Orders

## 2011-03-18 ENCOUNTER — Telehealth: Payer: Self-pay | Admitting: *Deleted

## 2011-03-18 NOTE — Telephone Encounter (Signed)
Pt called and stated provider was suppose to call in anti-depressant and pre-natal vitamin to [facility] on [address] but was never done. Advised pt that would send to provider to review.

## 2011-03-21 ENCOUNTER — Ambulatory Visit: Payer: Medicaid Other | Admitting: Family Medicine

## 2011-04-09 ENCOUNTER — Encounter (HOSPITAL_COMMUNITY): Payer: Self-pay | Admitting: *Deleted

## 2011-04-09 ENCOUNTER — Emergency Department (INDEPENDENT_AMBULATORY_CARE_PROVIDER_SITE_OTHER)
Admission: EM | Admit: 2011-04-09 | Discharge: 2011-04-09 | Disposition: A | Discharge status: Home / Self Care | Payer: Medicaid Other | Source: Home / Self Care | Attending: Family Medicine | Admitting: Family Medicine

## 2011-04-09 DIAGNOSIS — N949 Unspecified condition associated with female genital organs and menstrual cycle: Secondary | ICD-10-CM

## 2011-04-09 DIAGNOSIS — N938 Other specified abnormal uterine and vaginal bleeding: Secondary | ICD-10-CM

## 2011-04-09 LAB — POCT URINALYSIS DIP (DEVICE)
Glucose, UA: NEGATIVE mg/dL
Nitrite: NEGATIVE
Specific Gravity, Urine: 1.015 (ref 1.005–1.030)
Urobilinogen, UA: 1 mg/dL (ref 0.0–1.0)
pH: 6.5 (ref 5.0–8.0)

## 2011-04-09 NOTE — ED Notes (Signed)
Pt  Reports  Low  abd  Pain  With  Some  Vag  Bleeding  - PT  REPORTS  A  FEW  DAYS  AGO  SHE  HAD  A  POS  PREG  TEST  AT  HOME   -  AT  THIS  TIME       SHE  IS  SITTING  UPRIGHT  ON  EXAM TABLE  IN NO  DISTRESS

## 2011-04-09 NOTE — ED Provider Notes (Signed)
History     CSN: [id number]  Arrival date & time 04/09/11  1239   First MD Initiated Contact with Patient 04/09/11 1354      Chief Complaint  Patient presents with  . Vaginal Bleeding    (Consider location/radiation/quality/duration/timing/severity/associated sxs/prior treatment) Patient is a 31 y.o. female presenting with vaginal bleeding. The history is provided by the patient.  Vaginal Bleeding This is a new problem. The current episode started 6 to 12 hours ago (had iud removed in dec, 1 day menses in early jan, , trying to get preg.). The problem has not changed (vag bleeding today less than menses, 2 sm clots noted, no fever, n,v.) since onset.Associated symptoms include abdominal pain.    Past Medical History  Diagnosis Date  . Asthma     Past Surgical History  Procedure Date  . Cesarean section     Family History  Problem Relation Age of Onset  . Diabetes Other   . Cancer Other     History  Substance Use Topics  . Smoking status: Never Smoker   . Smokeless tobacco: Not on file  . Alcohol Use: No    OB History    Grav Para Term Preterm Abortions TAB SAB Ect Mult Living                  Review of Systems  Constitutional: Negative.   Gastrointestinal: Positive for abdominal pain.  Genitourinary: Positive for vaginal bleeding, menstrual problem and pelvic pain. Negative for dysuria, frequency, vaginal discharge and vaginal pain.    Allergies  Shrimp and Iodinated diagnostic agents  Home Medications   Current Outpatient Rx  Name Route Sig Dispense Refill  . ALBUTEROL SULFATE HFA 108 (90 BASE) MCG/ACT IN AERS Inhalation Inhale 2 puffs into the lungs every 6 (six) hours as needed. Asthma     . CENTRUM PO Oral Take 1 tablet by mouth daily.        BP 102/64  Pulse 85  Temp(Src) 98.6 F (37 C) (Oral)  Resp 16  SpO2 99%  LMP 03/14/2011  Physical Exam  Nursing note and vitals reviewed. Constitutional: She appears well-developed and  well-nourished.  HENT:  Head: Normocephalic.  Abdominal: Soft. Normal appearance and bowel sounds are normal. She exhibits no mass. There is tenderness in the suprapubic area. There is no rigidity, no rebound, no guarding and no CVA tenderness.    ED Course  Procedures (including critical care time)  Labs Reviewed  POCT URINALYSIS DIP (DEVICE) - Abnormal; Notable for the following:    Hgb urine dipstick LARGE (*)    All other components within normal limits  POCT PREGNANCY, URINE   No results found.   1. Dysfunctional uterine bleeding       MDM          Barkley Bruns, MD 04/09/11 1431

## 2012-03-31 ENCOUNTER — Telehealth: Payer: Self-pay | Admitting: Obstetrics and Gynecology

## 2012-03-31 NOTE — Telephone Encounter (Signed)
Patient called with questions about infertility. Gave her information of our prices that was listed under staff message and also about [facility] in [place] city. Patient satisfied.

## 2012-08-23 ENCOUNTER — Encounter: Payer: Self-pay | Admitting: Obstetrics

## 2012-11-22 ENCOUNTER — Ambulatory Visit: Payer: Self-pay | Admitting: Obstetrics

## 2013-02-17 ENCOUNTER — Encounter: Payer: Self-pay | Admitting: Obstetrics

## 2013-02-17 ENCOUNTER — Ambulatory Visit (INDEPENDENT_AMBULATORY_CARE_PROVIDER_SITE_OTHER): Payer: Medicaid Other | Admitting: Obstetrics

## 2013-02-17 VITALS — BP 129/93 | HR 89 | Temp 98.5°F | Wt 146.0 lb

## 2013-02-17 DIAGNOSIS — B9689 Other specified bacterial agents as the cause of diseases classified elsewhere: Secondary | ICD-10-CM

## 2013-02-17 DIAGNOSIS — A499 Bacterial infection, unspecified: Secondary | ICD-10-CM

## 2013-02-17 DIAGNOSIS — Z0000 Encounter for general adult medical examination without abnormal findings: Secondary | ICD-10-CM

## 2013-02-17 DIAGNOSIS — N760 Acute vaginitis: Secondary | ICD-10-CM

## 2013-02-17 MED ORDER — METRONIDAZOLE 500 MG PO TABS: 500.0000 mg | ORAL_TABLET | Freq: Two times a day (BID) | ORAL | Status: DC

## 2013-02-17 MED ORDER — PNV PRENATAL PLUS MULTIVITAMIN 27-1 MG PO TABS: 1.0000 | ORAL_TABLET | Freq: Every day | ORAL | Status: DC

## 2013-02-17 NOTE — Progress Notes (Signed)
Subjective:     [name] is a 32 y.o. female here for a problem exam.  Current complaints: Pt states that she has previously had a Mirena IUD and was removed in 2013.  Pt and husband have since been trying to conceive with no success.  Pt is wondering if she will need infertility treatment.  Pt states she has been treated for BV by primary care physician but is still having problems. Pt would like pap and testing today.  Personal health questionnaire reviewed: yes.   Gynecologic History Patient's last menstrual period was 01/24/2013. Contraception: none Last Pap: unsure. Results were: has had abnormal in past   Obstetric History OB History  Gravida Para Term Preterm AB SAB TAB Ectopic Multiple Living  3 2 2  1  1   2     # Outcome Date GA Lbr Len/2nd Weight Sex Delivery Anes PTL Lv  3 TRM 02/27/05    M    Y  2 TRM 11/16/01    F    Y  1 TAB                The following portions of the patient's history were reviewed and updated as appropriate: allergies, current medications, past family history, past medical history, past social history, past surgical history and problem list.  Review of Systems Pertinent items are noted in HPI.    Objective:    General appearance: alert and no distress Abdomen: normal findings: soft, non-tender Pelvic: cervix normal in appearance, external genitalia normal, no adnexal masses or tenderness, no cervical motion tenderness, rectovaginal septum normal, uterus normal size, shape, and consistency and vagina normal without discharge    Assessment:    Healthy female exam.  BV   Plan:    Education reviewed: safe sex/STD prevention and management of BV.   Flagyl Rx

## 2013-02-18 ENCOUNTER — Encounter: Payer: Self-pay | Admitting: Obstetrics

## 2013-02-18 LAB — GC/CHLAMYDIA PROBE AMP: GC Probe RNA: NEGATIVE

## 2013-02-18 LAB — WET PREP BY MOLECULAR PROBE: Trichomonas vaginosis: NEGATIVE

## 2013-03-16 ENCOUNTER — Other Ambulatory Visit: Payer: Self-pay | Admitting: *Deleted

## 2013-03-16 ENCOUNTER — Telehealth: Payer: Self-pay | Admitting: *Deleted

## 2013-03-16 DIAGNOSIS — N760 Acute vaginitis: Principal | ICD-10-CM

## 2013-03-16 DIAGNOSIS — B9689 Other specified bacterial agents as the cause of diseases classified elsewhere: Secondary | ICD-10-CM

## 2013-03-16 MED ORDER — TINIDAZOLE 500 MG PO TABS: 1.0000 g | ORAL_TABLET | Freq: Every day | ORAL | Status: DC

## 2013-03-16 NOTE — Telephone Encounter (Signed)
Patient called to request a change in her antibiotic- it is making her vomit. Per Dr Clearance CootsHarper- Tindamax sent to pharmacy- patient notified per voice mail.

## 2013-05-04 ENCOUNTER — Encounter: Payer: Self-pay | Admitting: Obstetrics

## 2013-07-27 ENCOUNTER — Other Ambulatory Visit (INDEPENDENT_AMBULATORY_CARE_PROVIDER_SITE_OTHER): Payer: Medicaid Other

## 2013-07-27 VITALS — BP 117/79 | HR 91 | Temp 98.1°F | Ht 68.5 in | Wt 142.0 lb

## 2013-07-27 DIAGNOSIS — Z3201 Encounter for pregnancy test, result positive: Secondary | ICD-10-CM

## 2013-07-27 LAB — POCT URINE PREGNANCY: Preg Test, Ur: POSITIVE

## 2013-07-27 NOTE — Progress Notes (Unsigned)
Patient in office today for a pregnancy test. Patient states she had a positive home pregnancy. Patient states she is unsure of her last menstrual period.  Pregnancy test in office is positive.   Encouraged patient to start prenatal vitamins and to schedule NOB.

## 2013-07-28 ENCOUNTER — Other Ambulatory Visit: Payer: Self-pay | Admitting: Obstetrics

## 2013-07-28 DIAGNOSIS — Z3687 Encounter for antenatal screening for uncertain dates: Secondary | ICD-10-CM

## 2013-08-03 ENCOUNTER — Other Ambulatory Visit: Payer: Medicaid Other

## 2013-08-03 ENCOUNTER — Ambulatory Visit: Payer: Medicaid Other

## 2013-08-03 DIAGNOSIS — Z3687 Encounter for antenatal screening for uncertain dates: Secondary | ICD-10-CM

## 2013-08-04 LAB — HCG, QUANTITATIVE, PREGNANCY: hCG, Beta Chain, Quant, S: 137493.9 m[IU]/mL

## 2013-08-08 ENCOUNTER — Telehealth: Payer: Self-pay | Admitting: *Deleted

## 2013-08-08 NOTE — Telephone Encounter (Signed)
Patient called the office very upset stating she has contacted the office several times to start her prenatal care. Upon searching the patient's chart it was discovered that the patient had been scheduled for an ultrasound to determined the patient's EDD due to uncertain LMP. This appointment was cancelled by the front staff. Unable to schedule an appointment for a NOB until patient has the ultrasound. Ultrasound has been rescheduled for 08-09-13 @ 2:30p. Will scheduled patient's NOB appointment after her ultrasound.

## 2013-08-09 ENCOUNTER — Other Ambulatory Visit (INDEPENDENT_AMBULATORY_CARE_PROVIDER_SITE_OTHER): Payer: Medicaid Other

## 2013-08-09 ENCOUNTER — Other Ambulatory Visit: Payer: Self-pay | Admitting: Obstetrics

## 2013-08-09 DIAGNOSIS — O3680X0 Pregnancy with inconclusive fetal viability, not applicable or unspecified: Secondary | ICD-10-CM

## 2013-08-09 LAB — US OB COMP LESS 14 WKS

## 2013-08-12 ENCOUNTER — Telehealth: Payer: Self-pay | Admitting: *Deleted

## 2013-08-12 NOTE — Telephone Encounter (Signed)
Pt called in to office stating that she is unable to keep anything down.   Return call to patient.  Pt states that she has had vomiting episodes for the past 4-5 days.  Pt states that she is unable to keep most food and some liquid down. Pt advised to hydrate as tolerated and to go be seen in MAU at [facility].  Pt states understanding and that she will go later today.  Pt advised to follow up with office after MAU visit.

## 2013-08-13 ENCOUNTER — Encounter (HOSPITAL_COMMUNITY): Payer: Self-pay | Admitting: *Deleted

## 2013-08-13 ENCOUNTER — Inpatient Hospital Stay (HOSPITAL_COMMUNITY)
Admission: AD | Admit: 2013-08-13 | Discharge: 2013-08-13 | Disposition: A | Discharge status: Home / Self Care | Payer: Medicaid Other | Source: Ambulatory Visit | Attending: Obstetrics | Admitting: Obstetrics

## 2013-08-13 DIAGNOSIS — O210 Mild hyperemesis gravidarum: Secondary | ICD-10-CM | POA: Insufficient documentation

## 2013-08-13 DIAGNOSIS — R9431 Abnormal electrocardiogram [ECG] [EKG]: Secondary | ICD-10-CM | POA: Diagnosis present

## 2013-08-13 DIAGNOSIS — R109 Unspecified abdominal pain: Secondary | ICD-10-CM | POA: Insufficient documentation

## 2013-08-13 DIAGNOSIS — K219 Gastro-esophageal reflux disease without esophagitis: Secondary | ICD-10-CM | POA: Insufficient documentation

## 2013-08-13 DIAGNOSIS — O219 Vomiting of pregnancy, unspecified: Secondary | ICD-10-CM | POA: Diagnosis present

## 2013-08-13 HISTORY — DX: Cardiac murmur, unspecified: R01.1

## 2013-08-13 HISTORY — DX: Headache: R51

## 2013-08-13 LAB — COMPREHENSIVE METABOLIC PANEL
ALBUMIN: 3.2 g/dL — AB (ref 3.5–5.2)
ALT: 25 U/L (ref 0–35)
AST: 24 U/L (ref 0–37)
Alkaline Phosphatase: 45 U/L (ref 39–117)
BUN: 9 mg/dL (ref 6–23)
CALCIUM: 9.4 mg/dL (ref 8.4–10.5)
CO2: 25 mEq/L (ref 19–32)
Chloride: 102 mEq/L (ref 96–112)
Creatinine, Ser: 0.68 mg/dL (ref 0.50–1.10)
GFR calc Af Amer: >90 mL/min (ref >90)
GFR calc non Af Amer: >90 mL/min (ref >90)
Glucose, Bld: 107 mg/dL — ABNORMAL HIGH (ref 70–99)
Potassium: 3.5 mEq/L — ABNORMAL LOW (ref 3.7–5.3)
SODIUM: 136 meq/L — AB (ref 137–147)
TOTAL PROTEIN: 6.4 g/dL (ref 6.0–8.3)
Total Bilirubin: 0.4 mg/dL (ref 0.3–1.2)

## 2013-08-13 LAB — URINALYSIS, ROUTINE W REFLEX MICROSCOPIC
Bilirubin Urine: NEGATIVE
Glucose, UA: NEGATIVE mg/dL
Hgb urine dipstick: NEGATIVE
Ketones, ur: NEGATIVE mg/dL
Leukocytes, UA: NEGATIVE
NITRITE: NEGATIVE
Protein, ur: NEGATIVE mg/dL
SPECIFIC GRAVITY, URINE: 1.01 (ref 1.005–1.030)
UROBILINOGEN UA: 0.2 mg/dL (ref 0.0–1.0)
pH: 6 (ref 5.0–8.0)

## 2013-08-13 LAB — CBC
HCT: 28.7 % — ABNORMAL LOW (ref 36.0–46.0)
Hemoglobin: 9.9 g/dL — ABNORMAL LOW (ref 12.0–15.0)
MCH: 28.2 pg (ref 26.0–34.0)
MCHC: 34.5 g/dL (ref 30.0–36.0)
MCV: 81.8 fL (ref 78.0–100.0)
PLATELETS: 114 10*3/uL — AB (ref 150–400)
RBC: 3.51 MIL/uL — AB (ref 3.87–5.11)
RDW: 15.4 % (ref 11.5–15.5)
WBC: 5.8 10*3/uL (ref 4.0–10.5)

## 2013-08-13 LAB — TROPONIN I: Troponin I: <0.3 ng/mL (ref <0.30)

## 2013-08-13 MED ORDER — RANITIDINE HCL 150 MG PO TABS: 150.0000 mg | ORAL_TABLET | Freq: Two times a day (BID) | ORAL | Status: DC

## 2013-08-13 MED ORDER — FAMOTIDINE 20 MG PO TABS
20.0000 mg | ORAL_TABLET | ORAL | Status: AC
  Administered 2013-08-13: 20 mg via ORAL
  Filled 2013-08-13: qty 1

## 2013-08-13 MED ORDER — FERROUS SULFATE 325 (65 FE) MG PO TABS: 325.0000 mg | ORAL_TABLET | Freq: Two times a day (BID) | ORAL | Status: DC

## 2013-08-13 MED ORDER — PROMETHAZINE HCL 25 MG PO TABS: 12.5000 mg | ORAL_TABLET | Freq: Four times a day (QID) | ORAL | Status: DC | PRN

## 2013-08-13 MED ORDER — ONDANSETRON 8 MG PO TBDP
8.0000 mg | ORAL_TABLET | ORAL | Status: AC
  Administered 2013-08-13: 8 mg via ORAL
  Filled 2013-08-13: qty 1

## 2013-08-13 MED ORDER — ONDANSETRON 4 MG PO TBDP: 4.0000 mg | ORAL_TABLET | Freq: Four times a day (QID) | ORAL | Status: DC | PRN

## 2013-08-13 NOTE — Discharge Instructions (Signed)
Nausea medication to take during pregnancy:   Unisom (doxylamine succinate 25 mg tablets) Take one tablet daily at bedtime. If symptoms are not adequately controlled, the dose can be increased to a maximum recommended dose of two tablets daily (1/2 tablet in the morning, 1/2 tablet mid-afternoon and one at bedtime).  Vitamin B6 100mg  tablets. Take one tablet twice a day (up to 200 mg per day).  Add Phenergan or Zofran as prescribed to take as needed.

## 2013-08-13 NOTE — MAU Provider Note (Signed)
Chief Complaint: Abdominal Pain, Extremity Weakness and Emesis During Pregnancy   First Provider Initiated Contact with Patient 08/13/13 0331     SUBJECTIVE HPI: [name] is a 33 y.o. [id number] at [redacted]w[redacted]d by LMP who presents to maternity admissions reporting n/v, weakness, and abdominal pain x5 days.  She was seen 4 days ago for U/S in office but reports she did not mention her symptoms at that time.  She does report anemia in the past but has not had labs drawn yet in this pregnancy.  She denies LOF, vaginal bleeding, vaginal itching/burning, urinary symptoms, h/a, or fever/chills.     Past Medical History  Diagnosis Date  . Asthma   . GERD (gastroesophageal reflux disease)   . Allergy   . Heart murmur   . DJSHFWYO(378.5)    Past Surgical History  Procedure Laterality Date  . Cesarean section     History   Social History  . Marital Status: Married    Spouse Name: N/A    Number of Children: N/A  . Years of Education: N/A   Occupational History  . Not on file.   Social History Main Topics  . Smoking status: Never Smoker   . Smokeless tobacco: Never Used  . Alcohol Use: No  . Drug Use: No  . Sexual Activity: Yes    Birth Control/ Protection: None   Other Topics Concern  . Not on file   Social History Narrative  . No narrative on file   No current facility-administered medications on file prior to encounter.   Current Outpatient Prescriptions on File Prior to Encounter  Medication Sig Dispense Refill  . albuterol (PROVENTIL HFA;VENTOLIN HFA) 108 (90 BASE) MCG/ACT inhaler Inhale 2 puffs into the lungs every 6 (six) hours as needed. Asthma       . Prenatal Vit-Fe Fumarate-FA (PNV PRENATAL PLUS MULTIVITAMIN) 27-1 MG TABS Take 1 tablet by mouth daily before breakfast.  30 tablet  11  . metroNIDAZOLE (FLAGYL) 500 MG tablet Take 1 tablet (500 mg total) by mouth 2 (two) times daily.  14 tablet  2  . Multiple Vitamins-Minerals (CENTRUM PO) Take 1 tablet by mouth daily.         Marland Kitchen tinidazole (TINDAMAX) 500 MG tablet Take 2 tablets (1,000 mg total) by mouth daily with breakfast.  10 tablet  0   Allergies  Allergen Reactions  . Shrimp [Shellfish Allergy] Anaphylaxis  . Iodinated Diagnostic Agents     Unknown    ROS: Pertinent items in HPI  OBJECTIVE Blood pressure 99/55, pulse 80, temperature 98.8 F (37.1 C), resp. rate 18, height 5\' 8"  (1.727 m), weight 67.132 kg (148 lb), last menstrual period 01/24/2013, SpO2 100.00%. GENERAL: Well-developed, well-nourished female in no acute distress.  HEENT: Normocephalic HEART: normal rate RESP: normal effort ABDOMEN: Soft, non-tender EXTREMITIES: Nontender, no edema NEURO: Alert and oriented SPECULUM EXAM: Deferred  LAB RESULTS Results for orders placed during the hospital encounter of 08/13/13 (from the past 24 hour(s))  URINALYSIS, ROUTINE W REFLEX MICROSCOPIC     Status: None   Collection Time    08/13/13  3:05 AM      Result Value Ref Range   Color, Urine YELLOW  YELLOW   APPearance CLEAR  CLEAR   Specific Gravity, Urine 1.010  1.005 - 1.030   pH 6.0  5.0 - 8.0   Glucose, UA NEGATIVE  NEGATIVE mg/dL   Hgb urine dipstick NEGATIVE  NEGATIVE   Bilirubin Urine NEGATIVE  NEGATIVE   Ketones, ur  NEGATIVE  NEGATIVE mg/dL   Protein, ur NEGATIVE  NEGATIVE mg/dL   Urobilinogen, UA 0.2  0.0 - 1.0 mg/dL   Nitrite NEGATIVE  NEGATIVE   Leukocytes, UA NEGATIVE  NEGATIVE  CBC     Status: Abnormal   Collection Time    08/13/13  3:40 AM      Result Value Ref Range   WBC 5.8  4.0 - 10.5 K/uL   RBC 3.51 (*) 3.87 - 5.11 MIL/uL   Hemoglobin 9.9 (*) 12.0 - 15.0 g/dL   HCT 16.1 (*) 09.6 - 04.5 %   MCV 81.8  78.0 - 100.0 fL   MCH 28.2  26.0 - 34.0 pg   MCHC 34.5  30.0 - 36.0 g/dL   RDW 40.9  81.1 - 91.4 %   Platelets 114 (*) 150 - 400 K/uL  COMPREHENSIVE METABOLIC PANEL     Status: Abnormal   Collection Time    08/13/13  3:40 AM      Result Value Ref Range   Sodium 136 (*) 137 - 147 mEq/L   Potassium 3.5 (*) 3.7  - 5.3 mEq/L   Chloride 102  96 - 112 mEq/L   CO2 25  19 - 32 mEq/L   Glucose, Bld 107 (*) 70 - 99 mg/dL   BUN 9  6 - 23 mg/dL   Creatinine, Ser 7.82  0.50 - 1.10 mg/dL   Calcium 9.4  8.4 - 95.6 mg/dL   Total Protein 6.4  6.0 - 8.3 g/dL   Albumin 3.2 (*) 3.5 - 5.2 g/dL   AST 24  0 - 37 U/L   ALT 25  0 - 35 U/L   Alkaline Phosphatase 45  39 - 117 U/L   Total Bilirubin 0.4  0.3 - 1.2 mg/dL   GFR calc non Af Amer >90  >90 mL/min   GFR calc Af Amer >90  >90 mL/min     ASSESSMENT 1. Nausea and vomiting in pregnancy prior to [redacted] weeks gestation   2. Nonspecific abnormal electrocardiogram (ECG) (EKG)   3. Acid reflux     PLAN Consult [facility] hospitalist, Dr Deterding to review EKG EKG abnormal, may need outpatient follow up but no acute requirements if labs normal Troponin I ordered--results WNL  Discharge home Pt given instructions to take Unisom/B6 Phenergan and Zofran Rx to use as needed Zantac 150 mg BID  F/U with Dr Clearance Coots Pt has gummy PNV.  May take these but need to add PO iron daily.     Medication List    STOP taking these medications       CENTRUM PO     metroNIDAZOLE 500 MG tablet  Commonly known as:  FLAGYL     tinidazole 500 MG tablet  Commonly known as:  TINDAMAX      TAKE these medications       albuterol 108 (90 BASE) MCG/ACT inhaler  Commonly known as:  PROVENTIL HFA;VENTOLIN HFA  Inhale 2 puffs into the lungs every 6 (six) hours as needed. Asthma     ondansetron 4 MG disintegrating tablet  Commonly known as:  ZOFRAN ODT  Take 1 tablet (4 mg total) by mouth every 6 (six) hours as needed for nausea.     PNV PRENATAL PLUS MULTIVITAMIN 27-1 MG Tabs  Take 1 tablet by mouth daily before breakfast.     promethazine 25 MG tablet  Commonly known as:  PHENERGAN  Take 0.5-1 tablets (12.5-25 mg total) by mouth every 6 (six) hours  as needed.     ranitidine 150 MG tablet  Commonly known as:  ZANTAC  Take 1 tablet (150 mg total) by mouth 2 (two) times  daily.       Follow-up Information   Follow up with HARPER,CHARLES A, MD.   Specialty:  Obstetrics and Gynecology   Contact information:   [address] [place] [address] [phone]       Sharen CounterLisa Leftwich-Kirby Certified Nurse-Midwife 08/13/2013  5:38 AM

## 2013-08-13 NOTE — MAU Note (Signed)
Lower abd pain. Unable to keep down anything. Very weak. Sometimes have chest pain and shoulder pain.

## 2013-08-18 ENCOUNTER — Encounter: Payer: Self-pay | Admitting: Obstetrics & Gynecology

## 2013-08-18 ENCOUNTER — Encounter: Payer: Self-pay | Admitting: Obstetrics

## 2013-08-30 ENCOUNTER — Encounter (HOSPITAL_COMMUNITY): Payer: Self-pay | Admitting: *Deleted

## 2013-08-30 ENCOUNTER — Inpatient Hospital Stay (HOSPITAL_COMMUNITY)
Admission: AD | Admit: 2013-08-30 | Discharge: 2013-08-30 | Disposition: A | Discharge status: Home / Self Care | Payer: Medicaid Other | Source: Ambulatory Visit | Attending: Obstetrics | Admitting: Obstetrics

## 2013-08-30 DIAGNOSIS — M25559 Pain in unspecified hip: Secondary | ICD-10-CM | POA: Insufficient documentation

## 2013-08-30 DIAGNOSIS — O9989 Other specified diseases and conditions complicating pregnancy, childbirth and the puerperium: Principal | ICD-10-CM

## 2013-08-30 DIAGNOSIS — R109 Unspecified abdominal pain: Secondary | ICD-10-CM | POA: Insufficient documentation

## 2013-08-30 DIAGNOSIS — M549 Dorsalgia, unspecified: Secondary | ICD-10-CM | POA: Insufficient documentation

## 2013-08-30 DIAGNOSIS — O210 Mild hyperemesis gravidarum: Secondary | ICD-10-CM | POA: Insufficient documentation

## 2013-08-30 DIAGNOSIS — K219 Gastro-esophageal reflux disease without esophagitis: Secondary | ICD-10-CM | POA: Insufficient documentation

## 2013-08-30 DIAGNOSIS — M25551 Pain in right hip: Secondary | ICD-10-CM

## 2013-08-30 DIAGNOSIS — O99891 Other specified diseases and conditions complicating pregnancy: Secondary | ICD-10-CM | POA: Insufficient documentation

## 2013-08-30 DIAGNOSIS — Z833 Family history of diabetes mellitus: Secondary | ICD-10-CM | POA: Insufficient documentation

## 2013-08-30 DIAGNOSIS — N949 Unspecified condition associated with female genital organs and menstrual cycle: Secondary | ICD-10-CM

## 2013-08-30 LAB — URINALYSIS, ROUTINE W REFLEX MICROSCOPIC
Bilirubin Urine: NEGATIVE
GLUCOSE, UA: NEGATIVE mg/dL
HGB URINE DIPSTICK: NEGATIVE
Ketones, ur: NEGATIVE mg/dL
Leukocytes, UA: NEGATIVE
Nitrite: NEGATIVE
PH: 5.5 (ref 5.0–8.0)
Protein, ur: NEGATIVE mg/dL
SPECIFIC GRAVITY, URINE: 1.02 (ref 1.005–1.030)
Urobilinogen, UA: 0.2 mg/dL (ref 0.0–1.0)

## 2013-08-30 MED ORDER — CYCLOBENZAPRINE HCL 10 MG PO TABS: 10.0000 mg | ORAL_TABLET | Freq: Two times a day (BID) | ORAL | Status: DC | PRN

## 2013-08-30 NOTE — Discharge Instructions (Signed)
Abdominal Pain During Pregnancy °Belly (abdominal) pain is common during pregnancy. Most of the time, it is not a serious problem. Other times, it can be a sign that something is wrong with the pregnancy. Always tell your doctor if you have belly pain. °HOME CARE °Monitor your belly pain for any changes. The following actions may help you feel better: °· Do not have sex (intercourse) or put anything in your vagina until you feel better. °· Rest until your pain stops. °· Drink clear fluids if you feel sick to your stomach (nauseous). Do not eat solid food until you feel better. °· Only take medicine as told by your doctor. °· Keep all doctor visits as told. °GET HELP RIGHT AWAY IF:  °· You are bleeding, leaking fluid, or pieces of tissue come out of your vagina. °· You have more pain or cramping. °· You keep throwing up (vomiting). °· You have pain when you pee (urinate) or have blood in your pee. °· You have a fever. °· You do not feel your baby moving as much. °· You feel very weak or feel like passing out. °· You have trouble breathing, with or without belly pain. °· You have a very bad headache and belly pain. °· You have fluid leaking from your vagina and belly pain. °· You keep having watery poop (diarrhea). °· Your belly pain does not go away after resting, or the pain gets worse. °MAKE SURE YOU:  °· Understand these instructions. °· Will watch your condition. °· Will get help right away if you are not doing well or get worse. °Document Released: 02/12/2009 Document Revised: 10/27/2012 Document Reviewed: 09/23/2012 °ExitCare® Patient Information ©2015 [employer] LLC. This information is not intended to replace advice given to you by your health care provider. Make sure you discuss any questions you have with your health care provider. °Back Pain in Pregnancy °Back pain during pregnancy is common. It happens in about half of all pregnancies. It is important for you and your baby that you remain active during  your pregnancy. If you feel that back pain is not allowing you to remain active or sleep well, it is time to see your caregiver. Back pain may be caused by several factors related to changes during your pregnancy. Fortunately, unless you had trouble with your back before your pregnancy, the pain is likely to get better after you deliver. °Low back pain usually occurs between the fifth and seventh months of pregnancy. It can, however, happen in the first couple months. Factors that increase the risk of back problems include:  °· Previous back problems. °· Injury to your back. °· Having twins or multiple births. °· A chronic cough. °· Stress. °· Job-related repetitive motions. °· Muscle or spinal disease in the back. °· Family history of back problems, ruptured (herniated) discs, or osteoporosis. °· Depression, anxiety, and panic attacks. °CAUSES  °· When you are pregnant, your body produces a hormone called relaxin. This hormone makes the ligaments connecting the low back and pubic bones more flexible. This flexibility allows the baby to be delivered more easily. When your ligaments are loose, your muscles need to work harder to support your back. Soreness in your back can come from tired muscles. Soreness can also come from back tissues that are irritated since they are receiving less support. °· As the baby grows, it puts pressure on the nerves and blood vessels in your pelvis. This can cause back pain. °· As the baby grows and gets heavier during pregnancy, the   uterus pushes the stomach muscles forward and changes your center of gravity. This makes your back muscles work harder to maintain good posture. °SYMPTOMS  °Lumbar pain during pregnancy °Lumbar pain during pregnancy usually occurs at or above the waist in the center of the back. There may be pain and numbness that radiates into your leg or foot. This is similar to low back pain experienced by non-pregnant women. It usually increases with sitting for long  periods of time, standing, or repetitive lifting. Tenderness may also be present in the muscles along your upper back. °Posterior pelvic pain during pregnancy °Pain in the back of the pelvis is more common than lumbar pain in pregnancy. It is a deep pain felt in your side at the waistline, or across the tailbone (sacrum), or in both places. You may have pain on one or both sides. This pain can also go into the buttocks and backs of the upper thighs. Pubic and groin pain may also be present. The pain does not quickly resolve with rest, and morning stiffness may also be present. °Pelvic pain during pregnancy can be brought on by most activities. A high level of fitness before and during pregnancy may or may not prevent this problem. Labor pain is usually 1 to 2 minutes apart, lasts for about 1 minute, and involves a bearing down feeling or pressure in your pelvis. However, if you are at term with the pregnancy, constant low back pain can be the beginning of early labor, and you should be aware of this. °DIAGNOSIS  °X-rays of the back should not be done during the first 12 to 14 weeks of the pregnancy and only when absolutely necessary during the rest of the pregnancy. MRIs do not give off radiation and are safe during pregnancy. MRIs also should only be done when absolutely necessary. °HOME CARE INSTRUCTIONS °· Exercise as directed by your caregiver. Exercise is the most effective way to prevent or manage back pain. If you have a back problem, it is especially important to avoid sports that require sudden body movements. Swimming and walking are great activities. °· Do not stand in one place for long periods of time. °· Do not wear high heels. °· Sit in chairs with good posture. Use a pillow on your lower back if necessary. Make sure your head rests over your shoulders and is not hanging forward. °· Try sleeping on your side, preferably the left side, with a pillow or two between your legs. If you are sore after a  night's rest, your bed may be too soft. Try placing a board between your mattress and box spring. °· Listen to your body when lifting. If you are experiencing pain, ask for help or try bending your knees more so you can use your leg muscles rather than your back muscles. Squat down when picking up something from the floor. Do not bend over. °· Eat a healthy diet. Try to gain weight within your caregiver's recommendations. °· Use heat or cold packs 3 to 4 times a day for 15 minutes to help with the pain. °· Only take over-the-counter or prescription medicines for pain, discomfort, or fever as directed by your caregiver. °Sudden (acute) back pain °· Use bed rest for only the most extreme, acute episodes of back pain. Prolonged bed rest over 48 hours will aggravate your condition. °· Ice is very effective for acute conditions. °¨ Put ice in a plastic bag. °¨ Place a towel between your skin and the bag. °¨ Leave the ice on for   10 to 20 minutes every 2 hours, or as needed.  Using heat packs for 30 minutes prior to activities is also helpful. Continued back pain See your caregiver if you have continued problems. Your caregiver can help or refer you for appropriate physical therapy. With conditioning, most back problems can be avoided. Sometimes, a more serious issue may be the cause of back pain. You should be seen right away if new problems seem to be developing. Your caregiver may recommend:  A maternity girdle.  An elastic sling.  A back brace.  A massage therapist or acupuncture. SEEK MEDICAL CARE IF:   You are not able to do most of your daily activities, even when taking the pain medicine you were given.  You need a referral to a physical therapist or chiropractor.  You want to try acupuncture. SEEK IMMEDIATE MEDICAL CARE IF:  You develop numbness, tingling, weakness, or problems with the use of your arms or legs.  You develop severe back pain that is no longer relieved with medicines.  You  have a sudden change in bowel or bladder control.  You have increasing pain in other areas of the body.  You develop shortness of breath, dizziness, or fainting.  You develop nausea, vomiting, or sweating.  You have back pain which is similar to labor pains.  You have back pain along with your water breaking or vaginal bleeding.  You have back pain or numbness that travels down your leg.  Your back pain developed after you fell.  You develop pain on one side of your back. You may have a kidney stone.  You see blood in your urine. You may have a bladder infection or kidney stone.  You have back pain with blisters. You may have shingles. Back pain is fairly common during pregnancy but should not be accepted as just part of the process. Back pain should always be treated as soon as possible. This will make your pregnancy as pleasant as possible. Document Released: 06/04/2005 Document Revised: 05/19/2011 Document Reviewed: 07/16/2010 [facility] Patient Information 2015 [employer] [place] [place]. This information is not intended to replace advice given to you by your health care provider. Make sure you discuss any questions you have with your health care provider.   Buy a [employer]aternity Belt (also called an abdominal binder) at [employer], [employer] or [employer]

## 2013-08-30 NOTE — MAU Note (Signed)
PT SAYS SHE  HAS BEEN HAVING CRAMPS-  HAS BEEN HERE BEFORE  FOR  THIS.  SAYS HER RIGHT HIP   IS WEAK  AND  PULLING PAIN  IN ABD.     SAYS SHE VOMITED YESTERDAY. AND HAS HX  OF ASTHMA.    HAS NOT SEEN DR HARPER   YET  - HAS AN APPOINTMENT-  6-29.    LAST SEX-  TODAY-  HIP HURT.

## 2013-08-30 NOTE — MAU Provider Note (Signed)
History     CSN: [id number]  Arrival date and time: 08/30/13 0030   First Provider Initiated Contact with Patient 08/30/13 0143      No chief complaint on file.  HPI Ms. Sherry BaldSandra [name] is a 33 y.o. [phone] at 4362w5d who presents to MAU today with complaint of abdominal, back and hip pain. The patient states that all of this has been present for a while. She has been evaluated in MAU previously. She states that abdominal pain is a pulling feeling in her lower abdomen. She denies vaginal bleeding or discharge. She states frequent nausea with less vomiting when taking anti-emetics. She denies fever or diarrhea. She does not feel she in constipated but has not had a bowel movement in ~ 1 week. She is also having low back and right hip pain. She states that this has been going on for a while. She states pain is improved with moderation of activity. She rates pain at 8/10 now. She has not taken anything for pain today. She states last intercourse this morning. Patient denies flank pain or UTI symptoms.   OB History   Grav Para Term Preterm Abortions TAB SAB Ect Mult Living   4 2 2  1 1    2       Past Medical History  Diagnosis Date  . Asthma   . GERD (gastroesophageal reflux disease)   . Allergy   . Heart murmur   . ZHYQMVHQ(469.6Headache(784.0)     Past Surgical History  Procedure Laterality Date  . Cesarean section      Family History  Problem Relation Age of Onset  . Diabetes Other   . Cancer Other   . Cancer Mother   . Heart defect Daughter   . Heart disease Maternal Grandmother     History  Substance Use Topics  . Smoking status: Never Smoker   . Smokeless tobacco: Never Used  . Alcohol Use: No    Allergies:  Allergies  Allergen Reactions  . Shrimp [Shellfish Allergy] Anaphylaxis  . Iodinated Diagnostic Agents     Unknown    Prescriptions prior to admission  Medication Sig Dispense Refill  . ferrous sulfate (FERROUSUL) 325 (65 FE) MG tablet Take 1 tablet (325 mg total) by  mouth 2 (two) times daily.  60 tablet  3  . ondansetron (ZOFRAN ODT) 4 MG disintegrating tablet Take 1 tablet (4 mg total) by mouth every 6 (six) hours as needed for nausea.  20 tablet  2  . Prenatal Vit-Fe Fumarate-FA (PNV PRENATAL PLUS MULTIVITAMIN) 27-1 MG TABS Take 1 tablet by mouth daily before breakfast.  30 tablet  11  . promethazine (PHENERGAN) 25 MG tablet Take 0.5-1 tablets (12.5-25 mg total) by mouth every 6 (six) hours as needed.  30 tablet  2  . ranitidine (ZANTAC) 150 MG tablet Take 1 tablet (150 mg total) by mouth 2 (two) times daily.  60 tablet  2  . albuterol (PROVENTIL HFA;VENTOLIN HFA) 108 (90 BASE) MCG/ACT inhaler Inhale 2 puffs into the lungs every 6 (six) hours as needed. Asthma         Review of Systems  Constitutional: Negative for fever and malaise/fatigue.  Gastrointestinal: Positive for nausea, vomiting and abdominal pain. Negative for diarrhea and constipation.  Genitourinary: Negative for dysuria, urgency and frequency.       Neg - vaginal bleeding, discharge  Musculoskeletal: Positive for back pain and joint pain.  Neurological: Positive for weakness. Negative for dizziness and loss of consciousness.  Physical Exam   Blood pressure 113/80, pulse 93, temperature 98.4 F (36.9 C), temperature source Oral, resp. rate 20, height 5\' 8"  (1.727 m), weight 146 lb 4 oz (66.339 kg), last menstrual period 01/24/2013.  Physical Exam  Constitutional: She is oriented to person, place, and time. She appears well-developed and well-nourished. No distress.  HENT:  Head: Normocephalic and atraumatic.  Cardiovascular: Normal rate.   Respiratory: Effort normal.  GI: Soft. Bowel sounds are normal. She exhibits no distension and no mass. There is tenderness (mild to moderate diffuse tenderness to palpation ). There is no rebound and no guarding.  Neurological: She is alert and oriented to person, place, and time.  Skin: Skin is warm and dry. No erythema.  Psychiatric: She has a  normal mood and affect.   Results for orders placed during the hospital encounter of 08/30/13 (from the past 24 hour(s))  URINALYSIS, ROUTINE W REFLEX MICROSCOPIC     Status: None   Collection Time    08/30/13 12:42 AM      Result Value Ref Range   Color, Urine YELLOW  YELLOW   APPearance CLEAR  CLEAR   Specific Gravity, Urine 1.020  1.005 - 1.030   pH 5.5  5.0 - 8.0   Glucose, UA NEGATIVE  NEGATIVE mg/dL   Hgb urine dipstick NEGATIVE  NEGATIVE   Bilirubin Urine NEGATIVE  NEGATIVE   Ketones, ur NEGATIVE  NEGATIVE mg/dL   Protein, ur NEGATIVE  NEGATIVE mg/dL   Urobilinogen, UA 0.2  0.0 - 1.0 mg/dL   Nitrite NEGATIVE  NEGATIVE   Leukocytes, UA NEGATIVE  NEGATIVE     MAU Course  Procedures  MDM FHR - 177 bpm with doppler UA today shows no signs of dehydration Patient is driving and unable to get a ride home. Will give Rx to take at home.   Assessment and Plan  A: Round ligament pain Back pain in pregnancy, first trimester Hip pain, acute, right   P: Discharge home Rx for Flexeril given to patient Patient advised to use abdominal binder, warm bath/shower and moderation of activity Patient advised to follow-up with Dr. Clearance CootsHarper as scheduled for routine prenatal care and possible referral for hip pain Patient may return to MAU as needed or if her condition were to change or worsen  Freddi StarrJulie N Ethier, PA-C  08/30/2013, 1:43 AM

## 2013-09-05 ENCOUNTER — Encounter: Payer: Self-pay | Admitting: Obstetrics

## 2013-09-05 ENCOUNTER — Ambulatory Visit (INDEPENDENT_AMBULATORY_CARE_PROVIDER_SITE_OTHER): Payer: Medicaid Other | Admitting: Obstetrics

## 2013-09-05 VITALS — BP 135/83 | HR 75 | Temp 98.8°F | Wt 142.0 lb

## 2013-09-05 DIAGNOSIS — R9431 Abnormal electrocardiogram [ECG] [EKG]: Secondary | ICD-10-CM

## 2013-09-05 DIAGNOSIS — O99119 Other diseases of the blood and blood-forming organs and certain disorders involving the immune mechanism complicating pregnancy, unspecified trimester: Secondary | ICD-10-CM

## 2013-09-05 DIAGNOSIS — O210 Mild hyperemesis gravidarum: Secondary | ICD-10-CM

## 2013-09-05 DIAGNOSIS — D696 Thrombocytopenia, unspecified: Secondary | ICD-10-CM

## 2013-09-05 DIAGNOSIS — Z3481 Encounter for supervision of other normal pregnancy, first trimester: Secondary | ICD-10-CM

## 2013-09-05 DIAGNOSIS — D689 Coagulation defect, unspecified: Secondary | ICD-10-CM

## 2013-09-05 DIAGNOSIS — Z3480 Encounter for supervision of other normal pregnancy, unspecified trimester: Secondary | ICD-10-CM

## 2013-09-05 DIAGNOSIS — O99111 Other diseases of the blood and blood-forming organs and certain disorders involving the immune mechanism complicating pregnancy, first trimester: Secondary | ICD-10-CM

## 2013-09-05 LAB — OB RESULTS CONSOLE GBS: GBS: NEGATIVE

## 2013-09-05 NOTE — Progress Notes (Signed)
  Subjective:    Sherry BaldSandra Datta is a 33 y.o. female being seen today for her obstetrical visit. She is at 4151w4d gestation. Patient reports: nausea, vomiting and right hip pain.  Has also had occasional chest pain and pressure..  Problem List Items Addressed This Visit   Abnormal EKG   Relevant Orders      Ambulatory referral to Cardiology      AMB Referral to Maternal Fetal Medicine (MFM)   Hyperemesis complicating pregnancy, antepartum   Relevant Orders      AMB Referral to Maternal Fetal Medicine (MFM)      US OB Comp Less 14 Wks   Thrombocytopenia complicating pregnancy   Relevant Orders      AMB Referral to Maternal Fetal Medicine (MFM)      US OB Comp Less 14 Wks    Other Visit Diagnoses   Encounter for supervision of other normal pregnancy in first trimester    -  Primary    Relevant Orders       Obstetric panel       HIV antibody       Hemoglobinopathy evaluation       Culture, OB Urine       Vit D  25 hydroxy (rtn osteoporosis monitoring)       Varicella zoster antibody, IgG       POCT urinalysis dipstick       Pap IG and HPV (high risk) DNA detection       WET PREP BY MOLECULAR PROBE       GC/Chlamydia Probe Amp       TSH      Patient Active Problem List   Diagnosis Date Noted  . Abnormal EKG 09/05/2013  . Hyperemesis complicating pregnancy, antepartum 09/05/2013  . Thrombocytopenia complicating pregnancy 09/05/2013  . Nonspecific abnormal electrocardiogram (ECG) (EKG) 08/13/2013  . Nausea and vomiting in pregnancy prior to [redacted] weeks gestation 08/13/2013    Objective:     BP 135/83  Pulse 75  Temp(Src) 98.8 F (37.1 C)  Wt 142 lb (64.411 kg)  LMP 01/24/2013 Uterine Size: Below umbilicus     Assessment:    Pregnancy @ 6051w4d  weeks Hyperemesis gravidarum Abnormal EKG Thrombocytopenia  Plan:   Diclegis Rx Referred to Cardiology for abnormal EKG. Referred to MFM for Thrombocytopenia - probably Gestational Thrombocytopenia   Problem list reviewed  and updated. Labs reviewed.  Follow up in 3 weeks. FIRST/CF mutation testing/NIPT/QUAD SCREEN/fragile X/[name] population testing/Spinal muscular atrophy discussed: requested. Role of ultrasound in pregnancy discussed; fetal survey: requested. Amniocentesis discussed: not indicated.

## 2013-09-06 ENCOUNTER — Encounter: Payer: Self-pay | Admitting: Cardiology

## 2013-09-06 ENCOUNTER — Institutional Professional Consult (permissible substitution): Payer: Medicaid Other | Admitting: Cardiology

## 2013-09-06 LAB — OBSTETRIC PANEL
Antibody Screen: NEGATIVE
Basophils Absolute: 0 10*3/uL (ref 0.0–0.1)
Basophils Relative: 0 % (ref 0–1)
EOS PCT: 1 % (ref 0–5)
Eosinophils Absolute: 0.1 10*3/uL (ref 0.0–0.7)
HEMATOCRIT: 34.5 % — AB (ref 36.0–46.0)
HEMOGLOBIN: 11.5 g/dL — AB (ref 12.0–15.0)
Hepatitis B Surface Ag: NEGATIVE
Lymphocytes Relative: 16 % (ref 12–46)
Lymphs Abs: 0.9 10*3/uL (ref 0.7–4.0)
MCH: 28.9 pg (ref 26.0–34.0)
MCHC: 33.3 g/dL (ref 30.0–36.0)
MCV: 86.7 fL (ref 78.0–100.0)
Monocytes Absolute: 0.4 10*3/uL (ref 0.1–1.0)
Monocytes Relative: 7 % (ref 3–12)
Neutro Abs: 4.3 10*3/uL (ref 1.7–7.7)
Neutrophils Relative %: 76 % (ref 43–77)
Platelets: 178 10*3/uL (ref 150–400)
RBC: 3.98 MIL/uL (ref 3.87–5.11)
RDW: 17.3 % — ABNORMAL HIGH (ref 11.5–15.5)
RUBELLA: 5.93 {index} — AB (ref <0.90)
Rh Type: POSITIVE
WBC: 5.7 10*3/uL (ref 4.0–10.5)

## 2013-09-06 LAB — POCT URINALYSIS DIPSTICK
Bilirubin, UA: NEGATIVE
Blood, UA: NEGATIVE
Glucose, UA: NEGATIVE
Ketones, UA: NEGATIVE
Leukocytes, UA: NEGATIVE
Nitrite, UA: NEGATIVE
PH UA: 6
PROTEIN UA: NEGATIVE
SPEC GRAV UA: 1.01
Urobilinogen, UA: NEGATIVE

## 2013-09-06 LAB — GC/CHLAMYDIA PROBE AMP
CT PROBE, AMP APTIMA: NEGATIVE
GC Probe RNA: NEGATIVE

## 2013-09-06 LAB — WET PREP BY MOLECULAR PROBE
CANDIDA SPECIES: NEGATIVE
GARDNERELLA VAGINALIS: POSITIVE — AB
TRICHOMONAS VAG: NEGATIVE

## 2013-09-06 LAB — VARICELLA ZOSTER ANTIBODY, IGG: Varicella IgG: >4000 Index — ABNORMAL HIGH (ref <135.00)

## 2013-09-06 LAB — HIV ANTIBODY (ROUTINE TESTING W REFLEX): HIV: NONREACTIVE

## 2013-09-06 LAB — VITAMIN D 25 HYDROXY (VIT D DEFICIENCY, FRACTURES): Vit D, 25-Hydroxy: 39 ng/mL (ref 30–89)

## 2013-09-06 LAB — TSH: TSH: 1.016 u[IU]/mL (ref 0.350–4.500)

## 2013-09-07 LAB — HEMOGLOBINOPATHY EVALUATION
HGB S QUANTITAION: 38.3 % — AB
Hemoglobin Other: 0 %
Hgb A2 Quant: 3 % (ref 2.2–3.2)
Hgb A: 58.7 % — ABNORMAL LOW (ref 96.8–97.8)
Hgb F Quant: 0 % (ref 0.0–2.0)

## 2013-09-07 LAB — PAP IG AND HPV HIGH-RISK: HPV DNA High Risk: NOT DETECTED

## 2013-09-07 LAB — CULTURE, OB URINE
COLONY COUNT: NO GROWTH
ORGANISM ID, BACTERIA: NO GROWTH

## 2013-09-12 ENCOUNTER — Ambulatory Visit (HOSPITAL_COMMUNITY): Payer: Medicaid Other

## 2013-09-13 ENCOUNTER — Ambulatory Visit (HOSPITAL_COMMUNITY): Payer: Medicaid Other

## 2013-09-13 ENCOUNTER — Ambulatory Visit (HOSPITAL_COMMUNITY): Admission: RE | Admit: 2013-09-13 | Payer: Medicaid Other | Source: Ambulatory Visit

## 2013-09-15 ENCOUNTER — Telehealth: Payer: Self-pay | Admitting: *Deleted

## 2013-09-15 ENCOUNTER — Encounter (HOSPITAL_COMMUNITY): Payer: Self-pay | Admitting: *Deleted

## 2013-09-15 ENCOUNTER — Inpatient Hospital Stay (HOSPITAL_COMMUNITY)
Admission: AD | Admit: 2013-09-15 | Discharge: 2013-09-15 | Disposition: A | Discharge status: Home / Self Care | Payer: Medicaid Other | Source: Ambulatory Visit | Attending: Obstetrics | Admitting: Obstetrics

## 2013-09-15 DIAGNOSIS — H8113 Benign paroxysmal vertigo, bilateral: Secondary | ICD-10-CM

## 2013-09-15 DIAGNOSIS — R42 Dizziness and giddiness: Secondary | ICD-10-CM | POA: Insufficient documentation

## 2013-09-15 DIAGNOSIS — Z833 Family history of diabetes mellitus: Secondary | ICD-10-CM | POA: Insufficient documentation

## 2013-09-15 DIAGNOSIS — R51 Headache: Secondary | ICD-10-CM | POA: Diagnosis not present

## 2013-09-15 DIAGNOSIS — K219 Gastro-esophageal reflux disease without esophagitis: Secondary | ICD-10-CM | POA: Diagnosis not present

## 2013-09-15 DIAGNOSIS — H538 Other visual disturbances: Secondary | ICD-10-CM | POA: Insufficient documentation

## 2013-09-15 LAB — COMPREHENSIVE METABOLIC PANEL
ALBUMIN: 3.3 g/dL — AB (ref 3.5–5.2)
ALK PHOS: 34 U/L — AB (ref 39–117)
ALT: 12 U/L (ref 0–35)
ANION GAP: 12 (ref 5–15)
AST: 21 U/L (ref 0–37)
BILIRUBIN TOTAL: 0.9 mg/dL (ref 0.3–1.2)
BUN: 7 mg/dL (ref 6–23)
CO2: 23 mEq/L (ref 19–32)
CREATININE: 0.63 mg/dL (ref 0.50–1.10)
Calcium: 8.6 mg/dL (ref 8.4–10.5)
Chloride: 102 mEq/L (ref 96–112)
GFR calc non Af Amer: >90 mL/min (ref >90)
GLUCOSE: 87 mg/dL (ref 70–99)
Potassium: 4 mEq/L (ref 3.7–5.3)
Sodium: 137 mEq/L (ref 137–147)
TOTAL PROTEIN: 6.9 g/dL (ref 6.0–8.3)

## 2013-09-15 LAB — URINALYSIS, ROUTINE W REFLEX MICROSCOPIC
BILIRUBIN URINE: NEGATIVE
GLUCOSE, UA: NEGATIVE mg/dL
HGB URINE DIPSTICK: NEGATIVE
KETONES UR: 15 mg/dL — AB
Leukocytes, UA: NEGATIVE
Nitrite: NEGATIVE
PH: 6 (ref 5.0–8.0)
PROTEIN: NEGATIVE mg/dL
Specific Gravity, Urine: 1.015 (ref 1.005–1.030)
Urobilinogen, UA: 1 mg/dL (ref 0.0–1.0)

## 2013-09-15 LAB — CBC
HEMATOCRIT: 30.3 % — AB (ref 36.0–46.0)
HEMOGLOBIN: 10.5 g/dL — AB (ref 12.0–15.0)
MCH: 29.2 pg (ref 26.0–34.0)
MCHC: 34.7 g/dL (ref 30.0–36.0)
MCV: 84.4 fL (ref 78.0–100.0)
Platelets: 142 10*3/uL — ABNORMAL LOW (ref 150–400)
RBC: 3.59 MIL/uL — ABNORMAL LOW (ref 3.87–5.11)
RDW: 15.3 % (ref 11.5–15.5)
WBC: 7.6 10*3/uL (ref 4.0–10.5)

## 2013-09-15 MED ORDER — METOCLOPRAMIDE HCL 5 MG/ML IJ SOLN
10.0000 mg | Freq: Once | INTRAMUSCULAR | Status: AC
  Administered 2013-09-15: 10 mg via INTRAVENOUS
  Filled 2013-09-15: qty 2

## 2013-09-15 MED ORDER — GUAIFENESIN ER 600 MG PO TB12: 600.0000 mg | ORAL_TABLET | Freq: Two times a day (BID) | ORAL | Status: DC

## 2013-09-15 MED ORDER — DEXAMETHASONE SODIUM PHOSPHATE 10 MG/ML IJ SOLN
10.0000 mg | Freq: Once | INTRAMUSCULAR | Status: AC
  Administered 2013-09-15: 10 mg via INTRAVENOUS
  Filled 2013-09-15: qty 1

## 2013-09-15 MED ORDER — DIPHENHYDRAMINE HCL 50 MG/ML IJ SOLN
25.0000 mg | Freq: Once | INTRAMUSCULAR | Status: AC
  Administered 2013-09-15: 25 mg via INTRAVENOUS
  Filled 2013-09-15: qty 1

## 2013-09-15 MED ORDER — DIPHENHYDRAMINE HCL 25 MG PO CAPS
25.0000 mg | ORAL_CAPSULE | Freq: Once | ORAL | Status: DC
  Filled 2013-09-15: qty 1

## 2013-09-15 MED ORDER — BUTALBITAL-APAP-CAFFEINE 50-325-40 MG PO TABS: 1.0000 | ORAL_TABLET | Freq: Four times a day (QID) | ORAL | Status: DC | PRN

## 2013-09-15 MED ORDER — DEXTROSE 5 % IN LACTATED RINGERS IV BOLUS
1000.0000 mL | Freq: Once | INTRAVENOUS | Status: AC
  Administered 2013-09-15: 1000 mL via INTRAVENOUS

## 2013-09-15 MED ORDER — MECLIZINE HCL 25 MG PO CHEW: CHEWABLE_TABLET | ORAL | Status: DC

## 2013-09-15 NOTE — Telephone Encounter (Signed)
Call forwarded at 3:22- patient complains of dizzy spells, Blurred vision and head ache- thinks she is going to go to the hospital

## 2013-09-15 NOTE — MAU Note (Signed)
Pt states she has a headache dizziness and blurred vision.pt states this has been going on for 2-3 days

## 2013-09-15 NOTE — MAU Note (Addendum)
Has been dizzy, could hardly get up past 2 days, having bad headaches.

## 2013-09-15 NOTE — MAU Provider Note (Signed)
History     CSN: [id number]  Arrival date and time: 09/15/13 1439   First Provider Initiated Contact with Patient 09/15/13 1550      Chief Complaint  Patient presents with  . Dizziness  . Headache   HPI  Pt is [redacted]w[redacted]d with onset of h/a and dizziness and blurred vision.  Pt denies lower abd pain, nausea and vomiting. Pt last ate last night hamburger helper. The pain is worse when she moves her head from one side to the other And room is spinning uncontrollable.    Past Medical History  Diagnosis Date  . Asthma   . GERD (gastroesophageal reflux disease)   . Allergy   . Heart murmur   . VHQIONGE(952.8)     Past Surgical History  Procedure Laterality Date  . Cesarean section      Family History  Problem Relation Age of Onset  . Diabetes Other   . Cancer Other   . Cancer Mother   . Heart defect Daughter   . Heart disease Maternal Grandmother     History  Substance Use Topics  . Smoking status: Never Smoker   . Smokeless tobacco: Never Used  . Alcohol Use: No    Allergies:  Allergies  Allergen Reactions  . Shrimp [Shellfish Allergy] Anaphylaxis  . Iodinated Diagnostic Agents     Unknown    Prescriptions prior to admission  Medication Sig Dispense Refill  . albuterol (PROVENTIL HFA;VENTOLIN HFA) 108 (90 BASE) MCG/ACT inhaler Inhale 2 puffs into the lungs every 6 (six) hours as needed. Asthma       . cyclobenzaprine (FLEXERIL) 10 MG tablet Take 1 tablet (10 mg total) by mouth 2 (two) times daily as needed for muscle spasms.  20 tablet  0  . ferrous sulfate (FERROUSUL) 325 (65 FE) MG tablet Take 1 tablet (325 mg total) by mouth 2 (two) times daily.  60 tablet  3  . ondansetron (ZOFRAN ODT) 4 MG disintegrating tablet Take 1 tablet (4 mg total) by mouth every 6 (six) hours as needed for nausea.  20 tablet  2  . Prenatal Vit-Fe Fumarate-FA (PNV PRENATAL PLUS MULTIVITAMIN) 27-1 MG TABS Take 1 tablet by mouth daily before breakfast.  30 tablet  11  . promethazine  (PHENERGAN) 25 MG tablet Take 0.5-1 tablets (12.5-25 mg total) by mouth every 6 (six) hours as needed.  30 tablet  2  . ranitidine (ZANTAC) 150 MG tablet Take 1 tablet (150 mg total) by mouth 2 (two) times daily.  60 tablet  2    Review of Systems  Constitutional: Negative for fever and chills.  Eyes: Positive for blurred vision and photophobia.  Gastrointestinal: Negative for nausea, vomiting, abdominal pain, diarrhea and constipation.  Genitourinary: Negative for dysuria.  Neurological: Positive for dizziness and headaches. Negative for speech change.   Physical Exam   Blood pressure 116/85, pulse 115, temperature 99.4 F (37.4 C), temperature source Oral, resp. rate 18, height 5\' 8"  (1.727 m), weight 141 lb (63.957 kg), last menstrual period 01/24/2013.  Physical Exam  Nursing note and vitals reviewed. Constitutional: She is oriented to person, place, and time. She appears well-developed and well-nourished. No distress.  HENT:  Head: Normocephalic.  Eyes: Pupils are equal, round, and reactive to light.  Neck: Normal range of motion. Neck supple.  Cardiovascular: Normal rate.   Respiratory: Effort normal.  GI: Soft. She exhibits no distension. There is no tenderness. There is no rebound.  Musculoskeletal: Normal range of motion.  Neurological: She  is alert and oriented to person, place, and time.  Skin: Skin is warm and dry.  Psychiatric: She has a normal mood and affect.    MAU Course  Procedures Results for orders placed during the hospital encounter of 09/15/13 (from the past 24 hour(s))  URINALYSIS, ROUTINE W REFLEX MICROSCOPIC     Status: Abnormal   Collection Time    09/15/13  3:15 PM      Result Value Ref Range   Color, Urine YELLOW  YELLOW   APPearance CLEAR  CLEAR   Specific Gravity, Urine 1.015  1.005 - 1.030   pH 6.0  5.0 - 8.0   Glucose, UA NEGATIVE  NEGATIVE mg/dL   Hgb urine dipstick NEGATIVE  NEGATIVE   Bilirubin Urine NEGATIVE  NEGATIVE   Ketones, ur 15  (*) NEGATIVE mg/dL   Protein, ur NEGATIVE  NEGATIVE mg/dL   Urobilinogen, UA 1.0  0.0 - 1.0 mg/dL   Nitrite NEGATIVE  NEGATIVE   Leukocytes, UA NEGATIVE  NEGATIVE  CBC     Status: Abnormal   Collection Time    09/15/13  4:00 PM      Result Value Ref Range   WBC 7.6  4.0 - 10.5 K/uL   RBC 3.59 (*) 3.87 - 5.11 MIL/uL   Hemoglobin 10.5 (*) 12.0 - 15.0 g/dL   HCT 16.130.3 (*) 09.636.0 - 04.546.0 %   MCV 84.4  78.0 - 100.0 fL   MCH 29.2  26.0 - 34.0 pg   MCHC 34.7  30.0 - 36.0 g/dL   RDW 40.915.3  81.111.5 - 91.415.5 %   Platelets 142 (*) 150 - 400 K/uL  COMPREHENSIVE METABOLIC PANEL     Status: Abnormal   Collection Time    09/15/13  4:00 PM      Result Value Ref Range   Sodium 137  137 - 147 mEq/L   Potassium 4.0  3.7 - 5.3 mEq/L   Chloride 102  96 - 112 mEq/L   CO2 23  19 - 32 mEq/L   Glucose, Bld 87  70 - 99 mg/dL   BUN 7  6 - 23 mg/dL   Creatinine, Ser 7.820.63  0.50 - 1.10 mg/dL   Calcium 8.6  8.4 - 95.610.5 mg/dL   Total Protein 6.9  6.0 - 8.3 g/dL   Albumin 3.3 (*) 3.5 - 5.2 g/dL   AST 21  0 - 37 U/L   ALT 12  0 - 35 U/L   Alkaline Phosphatase 34 (*) 39 - 117 U/L   Total Bilirubin 0.9  0.3 - 1.2 mg/dL   GFR calc non Af Amer >90  >90 mL/min   GFR calc Af Amer >90  >90 mL/min   Anion gap 12  5 - 15  pt given IVF 1 liter with 25mg  Benadryl, REglan 10mg , and Decadron 10mg .- pt got relief of her headache while she was lying down.  Pt got up and headache resumed- pt wanting to go home- will give RX for Fioricet Discussed with pt that she had increase in headache or any [facility]  Assessment and Plan  Headache and vertigo- Bonine; Fioricet, Mucinex Increase fluids Small frequent meals F/u with Dr. Rubin PayorHarper  [name] 09/15/2013, 3:51 PM

## 2013-09-15 NOTE — Progress Notes (Signed)
Pt states dizziness makes her headache worse.

## 2013-09-15 NOTE — Telephone Encounter (Signed)
Patient is at the hospital- per chart review.

## 2013-09-23 ENCOUNTER — Ambulatory Visit (HOSPITAL_COMMUNITY)
Admission: RE | Admit: 2013-09-23 | Discharge: 2013-09-23 | Disposition: A | Discharge status: Home / Self Care | Payer: Medicaid Other | Source: Ambulatory Visit | Attending: Obstetrics | Admitting: Obstetrics

## 2013-09-23 ENCOUNTER — Ambulatory Visit (HOSPITAL_COMMUNITY): Admission: RE | Admit: 2013-09-23 | Payer: Medicaid Other | Source: Ambulatory Visit

## 2013-09-23 ENCOUNTER — Other Ambulatory Visit: Payer: Self-pay | Admitting: Obstetrics

## 2013-09-23 ENCOUNTER — Encounter (HOSPITAL_COMMUNITY): Payer: Self-pay

## 2013-09-23 DIAGNOSIS — D696 Thrombocytopenia, unspecified: Secondary | ICD-10-CM | POA: Diagnosis not present

## 2013-09-23 DIAGNOSIS — O99111 Other diseases of the blood and blood-forming organs and certain disorders involving the immune mechanism complicating pregnancy, first trimester: Secondary | ICD-10-CM

## 2013-09-23 DIAGNOSIS — D689 Coagulation defect, unspecified: Secondary | ICD-10-CM | POA: Insufficient documentation

## 2013-09-23 DIAGNOSIS — O210 Mild hyperemesis gravidarum: Secondary | ICD-10-CM | POA: Diagnosis present

## 2013-09-23 DIAGNOSIS — Z3689 Encounter for other specified antenatal screening: Secondary | ICD-10-CM | POA: Diagnosis not present

## 2013-09-23 DIAGNOSIS — O99119 Other diseases of the blood and blood-forming organs and certain disorders involving the immune mechanism complicating pregnancy, unspecified trimester: Secondary | ICD-10-CM

## 2013-09-26 ENCOUNTER — Other Ambulatory Visit: Payer: Self-pay | Admitting: *Deleted

## 2013-09-26 DIAGNOSIS — O219 Vomiting of pregnancy, unspecified: Secondary | ICD-10-CM

## 2013-09-26 MED ORDER — DOXYLAMINE-PYRIDOXINE 10-10 MG PO TBEC: 3.0000 | DELAYED_RELEASE_TABLET | ORAL | Status: DC

## 2013-09-27 ENCOUNTER — Encounter (HOSPITAL_COMMUNITY): Payer: Self-pay

## 2013-09-27 ENCOUNTER — Ambulatory Visit (HOSPITAL_COMMUNITY)
Admission: RE | Admit: 2013-09-27 | Discharge: 2013-09-27 | Disposition: A | Discharge status: Home / Self Care | Payer: Medicaid Other | Source: Ambulatory Visit | Attending: Obstetrics | Admitting: Obstetrics

## 2013-09-27 DIAGNOSIS — Z809 Family history of malignant neoplasm, unspecified: Secondary | ICD-10-CM | POA: Insufficient documentation

## 2013-09-27 DIAGNOSIS — D696 Thrombocytopenia, unspecified: Secondary | ICD-10-CM | POA: Insufficient documentation

## 2013-09-27 DIAGNOSIS — Z833 Family history of diabetes mellitus: Secondary | ICD-10-CM | POA: Diagnosis not present

## 2013-09-27 DIAGNOSIS — O99119 Other diseases of the blood and blood-forming organs and certain disorders involving the immune mechanism complicating pregnancy, unspecified trimester: Secondary | ICD-10-CM

## 2013-09-27 DIAGNOSIS — O210 Mild hyperemesis gravidarum: Secondary | ICD-10-CM | POA: Diagnosis not present

## 2013-09-27 DIAGNOSIS — J45909 Unspecified asthma, uncomplicated: Secondary | ICD-10-CM | POA: Diagnosis not present

## 2013-09-27 DIAGNOSIS — R011 Cardiac murmur, unspecified: Secondary | ICD-10-CM | POA: Insufficient documentation

## 2013-09-27 DIAGNOSIS — Z8249 Family history of ischemic heart disease and other diseases of the circulatory system: Secondary | ICD-10-CM | POA: Diagnosis not present

## 2013-09-27 DIAGNOSIS — D689 Coagulation defect, unspecified: Secondary | ICD-10-CM | POA: Insufficient documentation

## 2013-09-27 DIAGNOSIS — K219 Gastro-esophageal reflux disease without esophagitis: Secondary | ICD-10-CM | POA: Insufficient documentation

## 2013-09-27 NOTE — Progress Notes (Signed)
MATERNAL FETAL MEDICINE CONSULT  Patient Name: [name] Medical Record Number:  [id number] Date of Birth: 1980/03/12 Requesting Physician Name:  [name], MD Date of Service: 09/27/2013  Chief Complaint Multiple medical issues  History of Present Illness [name] was seen today secondary to multiple medical issues at the request of [name], MD.  The patient is a 33 y.o. [name] [redacted]w[redacted]d with an EDD of 03/16/2014, by Ultrasound dating method.  [name] has been struggling with severe nausea and vomiting of pregnancy.  She reports she has lost a total of 5 lbs.  She had had little improvement with ondansetron and promethazine.  Dr. Clearance Coots recently started her on Diclegis which has greatly improved her symptoms.  She is now able to regularly eat and drink without vomiting.  During a visit to the MAU in early June [name] was noted to have thrombocytopenia and an abnormal EKG with normal a troponin.  Review of Systems Pertinent items are noted in HPI.  Patient History OB History  Gravida Para Term Preterm AB SAB TAB Ectopic Multiple Living  4 2 2  1  1   2     # Outcome Date GA Lbr Len/2nd Weight Sex Delivery Anes PTL Lv  4 CUR           3 TRM 02/27/05    Octaviano Glow   Y  2 TRM 11/16/01    F LTCS   Y  1 TAB               Past Medical History  Diagnosis Date  . Asthma   . GERD (gastroesophageal reflux disease)   . Allergy   . Heart murmur   . NWGNFAOZ(308.6)     Past Surgical History  Procedure Laterality Date  . Cesarean section      History   Social History  . Marital Status: Married    Spouse Name: N/A    Number of Children: N/A  . Years of Education: N/A   Social History Main Topics  . Smoking status: Never Smoker   . Smokeless tobacco: Never Used  . Alcohol Use: No  . Drug Use: No  . Sexual Activity: Yes    Birth Control/ Protection: None   Other Topics Concern  . None   Social History Narrative  . None    Family History   Problem Relation Age of Onset  . Diabetes Other   . Cancer Other   . Cancer Mother   . Heart defect Daughter   . Heart disease Maternal Grandmother    In addition, the patient has no family history of mental retardation, birth defects, or genetic diseases.  Physical Examination Vitals - BP 111/74, Pulse 113, Weight 132 lbs. General appearance - alert, well appearing, and in no distress  Assessment and Recommendations 1.  Nausea and vomiting of pregnancy.  Fortunately, [name]'s symptoms are well controlled on Diclegis.  I recommend continuing it along with the ondansetron and promethazine.  I have no new agents to recommend as she is already on an appropriate anti-emetic regimen.  I suspect this will be an ongoing struggle for [name] for at least the next several weeks or possibly longer.  IV hydration should be provided when her symptoms prevent adequate oral fluid intake. 2.  Thrombocytopenia.  At the time of her visit to the MAU in June she was noted to have a platelet count of 114.  They were most recently 147 1 week  ago.  I suspect [name] has gestational thrombocytopenia.  However, as her platelet count is still very high it is unlikely to cause any problems during pregnancy.  A CBC should be checked as usual at approximately 28 weeks.  It should also be repeated close to the time of delivery to make certain it has not dropped any further. 3.  Abnormal EKG.  As [name] has an appointment with Cardiology in the near future, I will defer management of this issue to them. 4.  Family history of congenital cardiac disease.  [name] first daughter had a double outlet right ventricle and has required several surgeries.  Given this family history she should have a fetal echocardiogram to rule out recurrent congenital heart disease in this pregnancy.   I spent 30 minutes with [name] today of which 50% was face-to-face counseling.  [name].  Please do not hesitate to contact us with questions.   Rema FendtNITSCHE,Caton Popowski, MD

## 2013-09-28 ENCOUNTER — Encounter: Payer: Medicaid Other | Admitting: Obstetrics

## 2013-10-05 ENCOUNTER — Other Ambulatory Visit: Payer: Self-pay | Admitting: *Deleted

## 2013-10-05 DIAGNOSIS — B9689 Other specified bacterial agents as the cause of diseases classified elsewhere: Secondary | ICD-10-CM

## 2013-10-05 DIAGNOSIS — N760 Acute vaginitis: Principal | ICD-10-CM

## 2013-10-05 MED ORDER — TINIDAZOLE 500 MG PO TABS: 1.0000 g | ORAL_TABLET | Freq: Every day | ORAL | Status: DC

## 2013-10-13 ENCOUNTER — Ambulatory Visit (INDEPENDENT_AMBULATORY_CARE_PROVIDER_SITE_OTHER): Payer: Medicaid Other | Admitting: Obstetrics

## 2013-10-13 ENCOUNTER — Encounter: Payer: Self-pay | Admitting: Obstetrics

## 2013-10-13 VITALS — BP 107/74 | HR 109 | Temp 97.0°F | Wt 149.0 lb

## 2013-10-13 DIAGNOSIS — Z3482 Encounter for supervision of other normal pregnancy, second trimester: Secondary | ICD-10-CM

## 2013-10-13 DIAGNOSIS — Z3480 Encounter for supervision of other normal pregnancy, unspecified trimester: Secondary | ICD-10-CM

## 2013-10-13 LAB — POCT URINALYSIS DIPSTICK
Glucose, UA: NEGATIVE
Ketones, UA: NEGATIVE
Leukocytes, UA: NEGATIVE
NITRITE UA: NEGATIVE
RBC UA: NEGATIVE
Spec Grav, UA: 1.015
pH, UA: 5

## 2013-10-13 NOTE — Progress Notes (Signed)
Subjective:    [name] is a 33 y.o. female being seen today for her obstetrical visit. She is at 256w0d gestation. Patient reports: no complaints . Fetal movement: normal.  Problem List Items Addressed This Visit   None    Visit Diagnoses   Encounter for supervision of other normal pregnancy in second trimester    -  Primary    Relevant Orders       POCT urinalysis dipstick      Patient Active Problem List   Diagnosis Date Noted  . Abnormal EKG 09/05/2013  . Hyperemesis complicating pregnancy, antepartum 09/05/2013  . Thrombocytopenia complicating pregnancy 09/05/2013  . Nonspecific abnormal electrocardiogram (ECG) (EKG) 08/13/2013  . Nausea and vomiting in pregnancy prior to [redacted] weeks gestation 08/13/2013   Objective:    BP 107/74  Pulse 109  Temp(Src) 97 F (36.1 C)  Wt 149 lb (67.586 kg)  LMP 06/03/2013 FHT: 150 BPM  Uterine Size: size equals dates     Assessment:    Pregnancy @ 316w0d    Plan:    OBGCT: discussed.  Labs, problem list reviewed and updated 2 hr GTT planned Follow up in 4 weeks.

## 2013-10-17 ENCOUNTER — Ambulatory Visit (INDEPENDENT_AMBULATORY_CARE_PROVIDER_SITE_OTHER): Payer: Medicaid Other | Admitting: Interventional Cardiology

## 2013-10-17 ENCOUNTER — Encounter: Payer: Self-pay | Admitting: Interventional Cardiology

## 2013-10-17 VITALS — BP 90/82 | HR 111 | Ht 68.5 in | Wt 149.8 lb

## 2013-10-17 DIAGNOSIS — R9431 Abnormal electrocardiogram [ECG] [EKG]: Secondary | ICD-10-CM

## 2013-10-17 NOTE — Progress Notes (Signed)
Patient ID: [name], female   DOB: [birth year], 33 y.o.   MRN: [id number]   Date: 10/17/2013 ID: [name], DOB 1980/04/03, MRN [id number] PCP: [name], MD  Reason: Abnormal ECG  ASSESSMENT;  1. 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 ECG from the [facility] Emergency Department. My over read of the EKG is that the poor R-wave progression is due to lead position and not an abnormality. A repeat EKG today is very similar and it shows basically a normal pattern. Overall assessment is that her EKG is normal 2. Dizziness 3. Recurring headaches, this is been giving her trouble off and on for multiple years. 4. Recurring nausea 5. Pregnancy  PLAN:  1. Clinical observation. No clinical features are abnormalities to suggest a significant underlying heart problem 2. Headache evaluation post pregnancy.   SUBJECTIVE: [name] is a 33 y.o. female who is referred because of a "abnormal ECG". The ECG findings are that of QS pattern in V1 and V2 on in the emergency room the ECG done in June of 2015. The patient was in the emergency room with nausea and an EKG was done and somewhat sidetracked her evaluation. Upon more detailed questioning the patient gives a 2-3 year history recurring episodes of substernal and parasternal tightness in the chest. The discomfort can last anywhere from one to 10 minutes before resolving. It occurs spontaneously. There is no exertion related to the occurrence of the discomfort. She has no history of palpitations, syncope, edema, heart murmur, or underlying heart problem. Her oldest child has double outlet right ventricle with VSD. The child has a Sherrine Maples shunt this seems to be functioning quite normally and the talus progressing quite well. The patient has 3 siblings that are without any history of heart disease. Her father's side of the family has coronary artery disease. The mother's side of the family has high blood  pressure and diabetes. The patient has none of these.   Allergies  Allergen Reactions  . Shrimp [Shellfish Allergy] Anaphylaxis  . Iodinated Diagnostic Agents     Unknown    Current Outpatient Prescriptions on File Prior to Visit  Medication Sig Dispense Refill  . albuterol (PROVENTIL HFA;VENTOLIN HFA) 108 (90 BASE) MCG/ACT inhaler Inhale 2 puffs into the lungs every 6 (six) hours as needed. Asthma       . butalbital-acetaminophen-caffeine (FIORICET) 50-325-40 MG per tablet Take 1-2 tablets by mouth every 6 (six) hours as needed for headache.  20 tablet  0  . cyclobenzaprine (FLEXERIL) 10 MG tablet Take 1 tablet (10 mg total) by mouth 2 (two) times daily as needed for muscle spasms.  20 tablet  0  . ranitidine (ZANTAC) 150 MG tablet Take 1 tablet (150 mg total) by mouth 2 (two) times daily.  60 tablet  2  . tinidazole (TINDAMAX) 500 MG tablet Take 2 tablets (1,000 mg total) by mouth daily.  10 tablet  0   No current facility-administered medications on file prior to visit.    Past Medical History  Diagnosis Date  . Asthma   . GERD (gastroesophageal reflux disease)   . Allergy   . Heart murmur   . NWGNFAOZ(308.6)     Past Surgical History  Procedure Laterality Date  . Cesarean section      History   Social History  . Marital Status: Married    Spouse Name: N/A    Number of Children: N/A  . Years of Education: N/A   Occupational History  .  Not on file.   Social History Main Topics  . Smoking status: Never Smoker   . Smokeless tobacco: Never Used  . Alcohol Use: No  . Drug Use: No  . Sexual Activity: Yes    Birth Control/ Protection: None   Other Topics Concern  . Not on file   Social History Narrative  . No narrative on file    Family History  Problem Relation Age of Onset  . Diabetes Other   . Cancer Other   . Cancer Mother   . Heart defect Daughter   . Heart disease Maternal Grandmother     ROS: History of gastroesophageal reflux. No history of  syncope, hypertension, claudication, edema, PE, DVT, weight loss, stroke, heart arrhythmia, or other significant medical problems.. Other systems negative for complaints.  OBJECTIVE: BP 90/82  Pulse 111  Ht 5' 8.5" (1.74 m)  Wt 149 lb 12.8 oz (67.949 kg)  BMI 22.44 kg/m2  LMP 06/03/2013,  General: No acute distress, slender, pregnant, appearing younger than stated age HEENT: normal without pallor jaundice Neck: JVD flat. Carotids absent Chest: Clear Cardiac: Murmur: 1/6 systolic murmur compatible with flow. Gallop: Normal. Rhythm: Normal. Other: Normal Abdomen: Bruit: Absent. Pulsation: Absent Extremities: Edema: Absent. Pulses: 2+ and symmetric Neuro: Normal Psych: Normal  ECG: Normal sinus rhythm and normal pattern with vertical axis. This tracing is not significantly different than the one that was read as showing a septal infarct in June.

## 2013-10-17 NOTE — Patient Instructions (Signed)
Your physician recommends that you continue on your current medications as directed. Please refer to the Current Medication list given to you today.  No Follow up neded

## 2013-11-10 ENCOUNTER — Encounter: Payer: Medicaid Other | Admitting: Obstetrics

## 2013-11-15 ENCOUNTER — Encounter: Payer: Medicaid Other | Admitting: Obstetrics

## 2013-11-18 ENCOUNTER — Encounter: Payer: Medicaid Other | Admitting: Obstetrics

## 2013-11-23 ENCOUNTER — Encounter: Payer: Medicaid Other | Admitting: Obstetrics

## 2013-12-01 ENCOUNTER — Ambulatory Visit (INDEPENDENT_AMBULATORY_CARE_PROVIDER_SITE_OTHER): Payer: Medicaid Other | Admitting: Obstetrics

## 2013-12-01 VITALS — BP 108/74 | HR 94 | Wt 153.0 lb

## 2013-12-01 DIAGNOSIS — R52 Pain, unspecified: Secondary | ICD-10-CM

## 2013-12-01 DIAGNOSIS — O99112 Other diseases of the blood and blood-forming organs and certain disorders involving the immune mechanism complicating pregnancy, second trimester: Secondary | ICD-10-CM

## 2013-12-01 DIAGNOSIS — D689 Coagulation defect, unspecified: Secondary | ICD-10-CM

## 2013-12-01 DIAGNOSIS — D696 Thrombocytopenia, unspecified: Secondary | ICD-10-CM

## 2013-12-01 DIAGNOSIS — Z3482 Encounter for supervision of other normal pregnancy, second trimester: Secondary | ICD-10-CM

## 2013-12-01 DIAGNOSIS — Z3480 Encounter for supervision of other normal pregnancy, unspecified trimester: Secondary | ICD-10-CM

## 2013-12-01 DIAGNOSIS — O99119 Other diseases of the blood and blood-forming organs and certain disorders involving the immune mechanism complicating pregnancy, unspecified trimester: Secondary | ICD-10-CM

## 2013-12-01 MED ORDER — TRAMADOL HCL 50 MG PO TABS: 50.0000 mg | ORAL_TABLET | Freq: Four times a day (QID) | ORAL | Status: DC | PRN

## 2013-12-02 ENCOUNTER — Encounter: Payer: Self-pay | Admitting: Obstetrics

## 2013-12-02 ENCOUNTER — Telehealth: Payer: Self-pay

## 2013-12-02 NOTE — Progress Notes (Signed)
Subjective:    [name] is a 33 y.o. female being seen today for her obstetrical visit. She is at [redacted]w[redacted]d gestation. Patient reports: no complaints . Fetal movement: normal.  Problem List Items Addressed This Visit   None    Visit Diagnoses   Pain aggravated by activities of daily living    -  Primary    Relevant Medications       ULTRAM 50 MG PO TABS      Patient Active Problem List   Diagnosis Date Noted  . Abnormal EKG 09/05/2013  . Hyperemesis complicating pregnancy, antepartum 09/05/2013  . Thrombocytopenia complicating pregnancy 09/05/2013  . Nonspecific abnormal electrocardiogram (ECG) (EKG) 08/13/2013  . Nausea and vomiting in pregnancy prior to [redacted] weeks gestation 08/13/2013   Objective:    BP 108/74  Pulse 94  Wt 153 lb (69.4 kg)  LMP 06/03/2013 FHT: 150 BPM  Uterine Size: size equals dates     Assessment:    Pregnancy @ [redacted]w[redacted]d    Plan:    OBGCT: ordered.  Labs, problem list reviewed and updated 2 hr GTT planned Follow up in 2 weeks.

## 2013-12-02 NOTE — Telephone Encounter (Signed)
Told patient time and date of appt at [facility] - maternal fetal care - 10/2 at [facility]

## 2013-12-08 LAB — POCT URINALYSIS DIPSTICK
BILIRUBIN UA: NEGATIVE
Blood, UA: NEGATIVE
GLUCOSE UA: NEGATIVE
Ketones, UA: NEGATIVE
Nitrite, UA: NEGATIVE
Protein, UA: NEGATIVE
SPEC GRAV UA: 1.015
UROBILINOGEN UA: NEGATIVE
pH, UA: 5.5

## 2013-12-08 NOTE — Addendum Note (Signed)
Addended by: Henriette CombsHATTON, Esco Joslyn L on: 12/08/2013 12:19 PM   Modules accepted: Orders

## 2013-12-09 ENCOUNTER — Other Ambulatory Visit: Payer: Self-pay | Admitting: Obstetrics

## 2013-12-09 ENCOUNTER — Ambulatory Visit (HOSPITAL_COMMUNITY): Payer: Medicaid Other

## 2013-12-09 ENCOUNTER — Inpatient Hospital Stay (HOSPITAL_COMMUNITY): Admission: RE | Admit: 2013-12-09 | Payer: Medicaid Other | Source: Ambulatory Visit

## 2013-12-09 ENCOUNTER — Encounter (HOSPITAL_COMMUNITY): Payer: Medicaid Other

## 2013-12-09 DIAGNOSIS — Z862 Personal history of diseases of the blood and blood-forming organs and certain disorders involving the immune mechanism: Secondary | ICD-10-CM

## 2013-12-16 ENCOUNTER — Encounter (HOSPITAL_COMMUNITY): Payer: Self-pay

## 2013-12-16 ENCOUNTER — Other Ambulatory Visit: Payer: Self-pay | Admitting: Obstetrics

## 2013-12-16 ENCOUNTER — Encounter (HOSPITAL_COMMUNITY): Payer: Medicaid Other

## 2013-12-16 ENCOUNTER — Encounter (HOSPITAL_COMMUNITY): Payer: Self-pay | Admitting: *Deleted

## 2013-12-16 ENCOUNTER — Other Ambulatory Visit (HOSPITAL_COMMUNITY): Payer: Medicaid Other

## 2013-12-16 ENCOUNTER — Ambulatory Visit (HOSPITAL_COMMUNITY): Admission: RE | Admit: 2013-12-16 | Payer: Medicaid Other | Source: Ambulatory Visit

## 2013-12-16 ENCOUNTER — Inpatient Hospital Stay (HOSPITAL_COMMUNITY)
Admission: AD | Admit: 2013-12-16 | Discharge: 2013-12-30 | DRG: 775 | Disposition: A | Discharge status: Home / Self Care | Payer: Medicaid Other | Source: Ambulatory Visit | Attending: Obstetrics | Admitting: Obstetrics

## 2013-12-16 ENCOUNTER — Ambulatory Visit (HOSPITAL_COMMUNITY)
Admission: RE | Admit: 2013-12-16 | Discharge: 2013-12-16 | Disposition: A | Discharge status: Home / Self Care | Payer: Medicaid Other | Source: Ambulatory Visit | Attending: Obstetrics | Admitting: Obstetrics

## 2013-12-16 DIAGNOSIS — O352XX1 Maternal care for (suspected) hereditary disease in fetus, fetus 1: Secondary | ICD-10-CM

## 2013-12-16 DIAGNOSIS — O43122 Velamentous insertion of umbilical cord, second trimester: Secondary | ICD-10-CM | POA: Diagnosis present

## 2013-12-16 DIAGNOSIS — D696 Thrombocytopenia, unspecified: Secondary | ICD-10-CM

## 2013-12-16 DIAGNOSIS — Z862 Personal history of diseases of the blood and blood-forming organs and certain disorders involving the immune mechanism: Secondary | ICD-10-CM

## 2013-12-16 DIAGNOSIS — Z3A27 27 weeks gestation of pregnancy: Secondary | ICD-10-CM | POA: Diagnosis present

## 2013-12-16 DIAGNOSIS — O99113 Other diseases of the blood and blood-forming organs and certain disorders involving the immune mechanism complicating pregnancy, third trimester: Secondary | ICD-10-CM

## 2013-12-16 DIAGNOSIS — O34219 Maternal care for unspecified type scar from previous cesarean delivery: Secondary | ICD-10-CM

## 2013-12-16 HISTORY — DX: Anemia, unspecified: D64.9

## 2013-12-16 LAB — CBC
HCT: 27.8 % — ABNORMAL LOW (ref 36.0–46.0)
Hemoglobin: 9.4 g/dL — ABNORMAL LOW (ref 12.0–15.0)
MCH: 27.6 pg (ref 26.0–34.0)
MCHC: 33.8 g/dL (ref 30.0–36.0)
MCV: 81.8 fL (ref 78.0–100.0)
PLATELETS: 153 10*3/uL (ref 150–400)
RBC: 3.4 MIL/uL — AB (ref 3.87–5.11)
RDW: 14.9 % (ref 11.5–15.5)
WBC: 11 10*3/uL — AB (ref 4.0–10.5)

## 2013-12-16 LAB — TYPE AND SCREEN
ABO/RH(D): O POS
Antibody Screen: NEGATIVE

## 2013-12-16 MED ORDER — DOCUSATE SODIUM 100 MG PO CAPS
100.0000 mg | ORAL_CAPSULE | Freq: Every day | ORAL | Status: DC
  Administered 2013-12-17 – 2013-12-30 (×14): 100 mg via ORAL
  Filled 2013-12-16 (×15): qty 1

## 2013-12-16 MED ORDER — ACETAMINOPHEN 325 MG PO TABS
650.0000 mg | ORAL_TABLET | ORAL | Status: DC | PRN
Start: 2013-12-16 — End: 2013-12-30

## 2013-12-16 MED ORDER — PRENATAL MULTIVITAMIN CH
1.0000 | ORAL_TABLET | Freq: Every day | ORAL | Status: DC
  Administered 2013-12-17 – 2013-12-30 (×14): 1 via ORAL
  Filled 2013-12-16 (×15): qty 1

## 2013-12-16 MED ORDER — BETAMETHASONE SOD PHOS & ACET 6 (3-3) MG/ML IJ SUSP
12.0000 mg | INTRAMUSCULAR | Status: AC
  Administered 2013-12-16 – 2013-12-17 (×2): 12 mg via INTRAMUSCULAR
  Filled 2013-12-16 (×2): qty 2

## 2013-12-16 MED ORDER — ZOLPIDEM TARTRATE 5 MG PO TABS
5.0000 mg | ORAL_TABLET | Freq: Every evening | ORAL | Status: DC | PRN
  Administered 2013-12-19: 5 mg via ORAL
  Filled 2013-12-16: qty 1

## 2013-12-16 MED ORDER — CALCIUM CARBONATE ANTACID 500 MG PO CHEW: 2.0000 | CHEWABLE_TABLET | ORAL | Status: DC | PRN

## 2013-12-16 NOTE — H&P (Signed)
This is Dr. Francoise CeoBernard Marshall dictating the history and physical on  [name]  she's a 33 year old gravida 4 para 201 to a 27 weeks today EDC 17 team who was seen by MFM today on her ultrasound showed  Vasa  previa and MFM suggested  Admittance  hospital for betamethasone 12 mg  Tonight t and 12 mg tomorrow and with a suggestion of keeping the patient in the hospital until delivery she is not contracting and has no complaints Past medical history negative Past surgical history negative Social history negative System review negative Physical exam well-developed female in no distress HEENT negative Lungs clear to P&A Heart regular rhythm no murmurs no gallops Abdomen 20 week size Pelvic deferred Extremities negative

## 2013-12-16 NOTE — ED Notes (Signed)
Spoke with Dr. Mikle Boswortharlos in NICU.  Pt ok to be admitted to antenatal with understanding that plan is for patient to remain pregnant.  If delivery is needed she may need to be transferred as NICU's census is high.

## 2013-12-17 LAB — CBC
HEMATOCRIT: 26.6 % — AB (ref 36.0–46.0)
Hemoglobin: 8.9 g/dL — ABNORMAL LOW (ref 12.0–15.0)
MCH: 27.3 pg (ref 26.0–34.0)
MCHC: 33.5 g/dL (ref 30.0–36.0)
MCV: 81.6 fL (ref 78.0–100.0)
Platelets: 180 10*3/uL (ref 150–400)
RBC: 3.26 MIL/uL — ABNORMAL LOW (ref 3.87–5.11)
RDW: 15 % (ref 11.5–15.5)
WBC: 11 10*3/uL — AB (ref 4.0–10.5)

## 2013-12-17 LAB — ABO/RH: ABO/RH(D): O POS

## 2013-12-17 MED ORDER — SODIUM CHLORIDE 0.9 % IJ SOLN
3.0000 mL | Freq: Two times a day (BID) | INTRAMUSCULAR | Status: DC
  Administered 2013-12-18 – 2013-12-29 (×18): 3 mL via INTRAVENOUS

## 2013-12-17 NOTE — Progress Notes (Signed)
Patient ID: [name], female   DOB: 06/11/1980, 33 y.o.   MRN: [id number] Vital signs normal No contractions No bleeding Doing well

## 2013-12-18 LAB — CBC
HCT: 26.2 % — ABNORMAL LOW (ref 36.0–46.0)
Hemoglobin: 8.9 g/dL — ABNORMAL LOW (ref 12.0–15.0)
MCH: 27.8 pg (ref 26.0–34.0)
MCHC: 34 g/dL (ref 30.0–36.0)
MCV: 81.9 fL (ref 78.0–100.0)
PLATELETS: 162 10*3/uL (ref 150–400)
RBC: 3.2 MIL/uL — ABNORMAL LOW (ref 3.87–5.11)
RDW: 15 % (ref 11.5–15.5)
WBC: 16.6 10*3/uL — AB (ref 4.0–10.5)

## 2013-12-18 MED ORDER — INFLUENZA VAC SPLIT QUAD 0.5 ML IM SUSY
0.5000 mL | PREFILLED_SYRINGE | INTRAMUSCULAR | Status: AC
  Administered 2013-12-19: 0.5 mL via INTRAMUSCULAR
  Filled 2013-12-18: qty 0.5

## 2013-12-18 NOTE — Progress Notes (Signed)
CSW provided pt's minor children with 2 meal vouchers.  Pt was appreciative & states someone will come pick the children up tonight.  CSW signing off.

## 2013-12-18 NOTE — Progress Notes (Signed)
Patient ID: [name], female   DOB: 05/12/1980, 33 y.o.   MRN: [id number] Vital signs normal condition unchanged doing well

## 2013-12-19 ENCOUNTER — Encounter: Payer: Medicaid Other | Admitting: Obstetrics

## 2013-12-19 LAB — CBC
HCT: 24.2 % — ABNORMAL LOW (ref 36.0–46.0)
HEMOGLOBIN: 8.2 g/dL — AB (ref 12.0–15.0)
MCH: 28 pg (ref 26.0–34.0)
MCHC: 33.9 g/dL (ref 30.0–36.0)
MCV: 82.6 fL (ref 78.0–100.0)
Platelets: 164 10*3/uL (ref 150–400)
RBC: 2.93 MIL/uL — ABNORMAL LOW (ref 3.87–5.11)
RDW: 14.6 % (ref 11.5–15.5)
WBC: 14.1 10*3/uL — AB (ref 4.0–10.5)

## 2013-12-19 LAB — TYPE AND SCREEN
ABO/RH(D): O POS
ANTIBODY SCREEN: NEGATIVE

## 2013-12-19 NOTE — Progress Notes (Signed)
[name] is coping as well as can be expected.  She is having a difficult time with being away from her family, but her husband is helping to get the kids over to the hospital as often as possible.  She finds her faith and prayer to be helpful to her and we shared prayer together today.  She went through several hospital admissions with her first daughter 12 years ago because of a heart condition.  Knowing that she was able to get through that even when she herself was young, is helping her cope with the strong possibility of a pre-term delivery and NICU admission.  I brought her some magazines and coloring sheets to help her find meaningful ways to pass the time and I offered reflective listening and prayer.  [employer] CorporationChaplain Katy Ulice Follett Pager, [phone] 4:12 PM

## 2013-12-19 NOTE — Progress Notes (Signed)
Patient complained of breast being sore, heavy and lumpy. Stating that this how her breast felt when she stop breast feeding her other children and milk was drying up. I informed lactation and they will speak with her regarding this issue.

## 2013-12-19 NOTE — Progress Notes (Signed)
Patient ID: [name], female   DOB: 04/24/1980, 33 y.o.   MRN: [id number] Hospital Day: 4  S: Preterm labor symptoms: None  O: Blood pressure 95/62, pulse 76, temperature 98.4 F (36.9 C), temperature source Oral, resp. rate 18, height 5\' 8"  (1.727 m), weight 159 lb 8 oz (72.349 kg), last menstrual period 06/03/2013.   WUJ:WJXBJYNWFHT:Baseline: 145 bpm Toco: None SVE:   A/P- 33 y.o. admitted with:  Vasa Previa.  Stable.  Continue bedrest.  Present on Admission:  . Vasa previa  Pregnancy Complications: placenta previa  Preterm labor management: no treatment necessary Dating:  647w4d PNL Needed:  stable FWB:  good PTL:  stable ROD: induced vaginal at 34 weeks.

## 2013-12-19 NOTE — Plan of Care (Signed)
Problem: Consults Goal: Chaplain Consult Outcome: Progressing Chaplain made visit today. Patient receptive to services offered.  Goal: Teacher, musicVolunteer Services (Diversional Activities, Age Appropriate) Outcome: Progressing Informed patient of various volunteer services that are available for antenatal patients. Patient receptive.

## 2013-12-19 NOTE — Plan of Care (Signed)
Problem: Phase I Progression Outcomes Goal: No significant worsening bleeding/cervix change/vag drainage No significant worsening in vaginal bleeding, cervical change, or vaginal drainage.  Outcome: Progressing Informed patient to notify nurse of any vaginal bleeding or drainage. Patient receptive.

## 2013-12-19 NOTE — Progress Notes (Signed)
2150 Hart Carwinoni Stanin RN informed lactation that Antenatal RN spoke with her about Room 155 needing to see lactation. 2155 spoke with Christain SacramentoJinean RN regarding patient's complaints of breast tenderness.  2200 Spoke with patient who complained of breast fullness, lumps and discomfort.  Mother noted that her breasts had recently become larger.  Explained that it is normal to have breast tenderness and enlargement during pregnancy. If discomfort getst worse or she notices any redness to her breast, she should call for assistance.  Spoke with her RN and possible mild analgesic may help. Unsure what MD has prescribed for mother.

## 2013-12-19 NOTE — Plan of Care (Signed)
Problem: Phase I Progression Outcomes Goal: Spiritual Needs (Notify SW/CM or Social Work) Outcome: Progressing Met with spiritual care today. Informed to let nurse know when she needs anything. Patient receptive.

## 2013-12-20 ENCOUNTER — Ambulatory Visit (HOSPITAL_COMMUNITY): Payer: Medicaid Other

## 2013-12-20 LAB — CBC
HCT: 25.2 % — ABNORMAL LOW (ref 36.0–46.0)
Hemoglobin: 8.5 g/dL — ABNORMAL LOW (ref 12.0–15.0)
MCH: 27.4 pg (ref 26.0–34.0)
MCHC: 33.7 g/dL (ref 30.0–36.0)
MCV: 81.3 fL (ref 78.0–100.0)
PLATELETS: 168 10*3/uL (ref 150–400)
RBC: 3.1 MIL/uL — ABNORMAL LOW (ref 3.87–5.11)
RDW: 15 % (ref 11.5–15.5)
WBC: 11.6 10*3/uL — ABNORMAL HIGH (ref 4.0–10.5)

## 2013-12-20 NOTE — Plan of Care (Signed)
Problem: Phase I Progression Outcomes Goal: Contractions < 5-6/hour Outcome: Progressing No contractions noted

## 2013-12-20 NOTE — Plan of Care (Signed)
Problem: Consults Goal: Case Manager Consult Outcome: Completed/Met Date Met:  12/20/13 CSW and Case Management following patient

## 2013-12-20 NOTE — Progress Notes (Signed)
UR completed 

## 2013-12-20 NOTE — Plan of Care (Signed)
Problem: Consults Goal: Case Manager Consult Outcome: Completed/Met Date Met:  12/20/13 Clinical Social Work Consult complete     

## 2013-12-20 NOTE — Progress Notes (Signed)
Patient ID: [name], female   DOB: 08/14/1980, 33 y.o.   MRN: [id number] Hospital Day: 5  S: No complaints.  O: Blood pressure 103/49, pulse 91, temperature 99 F (37.2 C), temperature source Oral, resp. rate 18, height 5\' 8"  (1.727 m), weight 159 lb 8 oz (72.349 kg), last menstrual period 06/03/2013.   NWG:NFAOZHYQFHT:Baseline: 145 bpm Toco: None SVE:   A/P- 33 y.o. admitted with:  Vasa previa.  Stable.  Continue bedrest.  Present on Admission:  . Vasa previa  Pregnancy Complications: vasa previa  Preterm labor management: no treatment necessary Dating:  8873w5d PNL Needed:  none FWB:  good PTL:  none

## 2013-12-21 ENCOUNTER — Ambulatory Visit (HOSPITAL_COMMUNITY): Payer: Medicaid Other

## 2013-12-21 NOTE — Progress Notes (Signed)
Patient ID: [name], female   DOB: 08/20/1980, 33 y.o.   MRN: [id number] Hospital Day: 6  S: No complaints.  O: Blood pressure 91/47, pulse 88, temperature 98.5 F (36.9 C), temperature source Oral, resp. rate 18, height 5\' 8"  (1.727 m), weight 159 lb 8 oz (72.349 kg), last menstrual period 06/03/2013, SpO2 96.00%.   WUJ:WJXBJYNWFHT:Baseline: 150 bpm Toco: None SVE:   A/P- 33 y.o. admitted with: Ultrasound finding of vasa previa.  No bleeding or contractions.  Stable.  Continue bedrest.  Present on Admission:  . Vasa previa  Pregnancy Complications: vasa previa  Preterm labor management: bedrest advised Dating:  4414w6d PNL Needed:  none FWB:  good PTL:  none

## 2013-12-22 LAB — TYPE AND SCREEN
ABO/RH(D): O POS
Antibody Screen: NEGATIVE

## 2013-12-22 NOTE — Progress Notes (Signed)
Antenatal Nutrition Assessment:  Currently  27 6/[redacted] weeks gestation, with vasa previa. Height  68 "  Weight 159 lbs  pre-pregnancy weight 145 lbs .  Pre-pregnancy  BMI 22.1  IBW 140 lbs Total weight gain 14.lbs Weight gain goals 25-35 lbs Estimated needs: 1900-2100 kcal/day, 70-80 grams protein/day, 2.2 liters fluid/day  Regular diet tolerated well, appetite good. Has Hx of hyperemesis. Pt reports nausea is minimal now. Requests double portions & snacks TID Current diet prescription will provide for increased needs.  No abnormal nutrition related labs  Nutrition Dx: Increased nutrient needs r/t pregnancy and fetal growth requirements aeb [redacted] weeks gestation.  No educational needs assessed at this time.  Elisabeth CaraKatherine Cyncere Ruhe M.Odis LusterEd. R.D. LDN Neonatal Nutrition Support Specialist/RD III Pager [phone]

## 2013-12-23 MED ORDER — POLYETHYLENE GLYCOL 3350 17 G PO PACK
17.0000 g | PACK | Freq: Two times a day (BID) | ORAL | Status: DC | PRN
  Administered 2013-12-23: 17 g via ORAL
  Filled 2013-12-23: qty 1

## 2013-12-23 NOTE — Progress Notes (Signed)
UR completed 

## 2013-12-24 NOTE — Plan of Care (Signed)
Problem: Phase II Progression Outcomes Goal: Tolerating diet Outcome: Progressing Pt tolerating meals. Goal: Output > 30 ml/hr or voiding qs Outcome: Progressing Adequate output.

## 2013-12-24 NOTE — Plan of Care (Signed)
Problem: Phase I Progression Outcomes Goal: Bowel movement every 2 days Outcome: Progressing Pt reported bowel movement on 12/23/13.  Problem: Phase II Progression Outcomes Goal: Tolerating diet Outcome: Progressing Pt tolerating diet. Goal: Progress activity as tolerated unless otherwise ordered Outcome: Progressing Pt with bathroom privileges. Goal: Output > 30 ml/hr or voiding qs Outcome: Progressing Pt voiding adequately.

## 2013-12-24 NOTE — Progress Notes (Signed)
Patient ID: [name], female   DOB: 02/11/1981, 33 y.o.   MRN: [id number] Vital signs normal Condition unchanged No contractions no bleeding

## 2013-12-25 LAB — TYPE AND SCREEN
ABO/RH(D): O POS
Antibody Screen: NEGATIVE

## 2013-12-25 NOTE — Progress Notes (Signed)
Patient ID: [name], female   DOB: 04/22/1980, 33 y.o.   MRN: [id number] Vital signs normal Is been no change

## 2013-12-26 NOTE — Progress Notes (Signed)
Patient ID: [name], female   DOB: 01/25/1981, 33 y.o.   MRN: [id number] Hospital Day: 3011  S: No complaints.  O: Blood pressure 115/62, pulse 99, temperature 98.3 F (36.8 C), temperature source Oral, resp. rate 18, height 5\' 8"  (1.727 m), weight 158 lb 12.8 oz (72.031 kg), last menstrual period 06/03/2013, SpO2 96.00%.   WUJ:WJXBJYNWFHT:Baseline: 150 bpm Toco: None SVE:   A/P- 33 y.o. admitted with:  Vasa Previa.  Stable.  Continue bedrest.  Present on Admission:  . Vasa previa  Pregnancy Complications: Vasa Previa  Preterm labor management: bedrest advised Dating:  3242w4d PNL Needed:  none FWB:  good PTL:  none

## 2013-12-26 NOTE — Progress Notes (Signed)
Ur chart review completed.  

## 2013-12-26 NOTE — Plan of Care (Signed)
Problem: Phase II Progression Outcomes Goal: Tolerating diet Outcome: Progressing Patient tolerating diet. Goal: Output > 30 ml/hr or voiding qs Outcome: Progressing Adequate output.

## 2013-12-27 NOTE — Plan of Care (Signed)
Problem: Phase I Progression Outcomes Goal: OOB as tolerated unless otherwise ordered Outcome: Completed/Met Date Met:  12/27/13 Bedrest with bathroom privileges.

## 2013-12-27 NOTE — Progress Notes (Signed)
12/27/13 1500  Clinical Encounter Type  Visited With Patient and family together (mom [name])  Visit Type Follow-up;Spiritual support;Social support  Spiritual Encounters  Spiritual Needs Emotional   Followed up to offer further emotional and spiritual support.  Sherry DavenportSandra stated that she is "fine" and engaged little in conversation.  Her mom [name] used the opportunity for theological reflection and processing her hopes and worries for supporting her daughter.  Family aware of ongoing chaplain availability for support, but please also page as needed:  [phone].  Thank you.  [address]Chaplain Carrie Usery [place], [place] [phone]

## 2013-12-27 NOTE — Progress Notes (Signed)
Patient ID: [name], female   DOB: 06/05/1980, 33 y.o.   MRN: [id number] Hospital Day: 7312  S: No complaints.  O: Blood pressure 110/66, pulse 88, temperature 98.6 F (37 C), temperature source Oral, resp. rate 20, height 5\' 8"  (1.727 m), weight 158 lb 12.8 oz (72.031 kg), last menstrual period 06/03/2013, SpO2 96.00%.   WUJ:WJXBJYNWFHT:Baseline: 150 bpm Toco: None SVE:   A/P- 33 y.o. admitted with:  Vasa previa on U/S.  Stable.  Continue bedrest.  Present on Admission:  . Vasa previa  Pregnancy Complications: Vasa Previa    Preterm labor management: bedrest advised Dating:  2558w5d PNL Needed:  stable FWB:  good PTL:  stable

## 2013-12-28 LAB — TYPE AND SCREEN
ABO/RH(D): O POS
ANTIBODY SCREEN: NEGATIVE

## 2013-12-28 NOTE — Progress Notes (Signed)
Patient ID: Sherry BaldSandra [name], female   DOB: 02/12/1981, 33 y.o.   MRN: [id number] Hospital Day: 4213  S: No complaints.  O: Blood pressure 102/54, pulse 87, temperature 98.3 F (36.8 C), temperature source Axillary, resp. rate 20, height 5\' 8"  (1.727 m), weight 158 lb 12.8 oz (72.031 kg), last menstrual period 06/03/2013, SpO2 96.00%.   MWN:UUVOZDGUFHT:Baseline: 150 bpm Toco: None SVE:   A/P- 33 y.o. admitted with: Ultrasound revealing Vasa Previa.  Stable.  Continue bedrest.  Present on Admission:  . Vasa previa  Pregnancy Complications: Vasa Previa  Preterm labor management: bedrest advised Dating:  2327w6d PNL Needed:  none FWB:  good PTL:  none

## 2013-12-29 ENCOUNTER — Inpatient Hospital Stay (HOSPITAL_COMMUNITY): Payer: Medicaid Other

## 2013-12-29 MED ORDER — TETANUS-DIPHTH-ACELL PERTUSSIS 5-2.5-18.5 LF-MCG/0.5 IM SUSP
0.5000 mL | Freq: Once | INTRAMUSCULAR | Status: AC
  Administered 2013-12-29: 0.5 mL via INTRAMUSCULAR
  Filled 2013-12-29: qty 0.5

## 2013-12-29 NOTE — Progress Notes (Signed)
Patient ID: [name], female   DOB: 06/20/1980, 33 y.o.   MRN: [id number] Hospital Day: 414  S: No complaints.  O: Blood pressure 105/61, pulse 92, temperature 98.9 F (37.2 C), temperature source Oral, resp. rate 16, height 5\' 8"  (1.727 m), weight 72.394 kg (159 lb 9.6 oz), last menstrual period 06/03/2013, SpO2 96.00%.   NWG:NFAOZHYQFHT:Baseline: 150 bpm Toco: None SVE:   A/P- 33 y.o.   Present on Admission:  . Vasa previa Stable   Pregnancy Complications: Vasa Previa  Preterm labor management: bedrest advised Dating:  7060w0d  PNL Needed:   2 hr gluc tol; offer TDAP FWB:  good PTL:  None Repeat U/S w/MFM

## 2013-12-29 NOTE — Plan of Care (Signed)
Problem: Consults Goal: Birthing Suites Patient Information Press F2 to bring up selections list  Outcome: Not Applicable Date Met:  00/94/17    Pt not a candidate for birthing suites at this time.

## 2013-12-30 ENCOUNTER — Ambulatory Visit (HOSPITAL_COMMUNITY): Payer: Medicaid Other

## 2013-12-30 ENCOUNTER — Inpatient Hospital Stay (HOSPITAL_COMMUNITY)
Admission: RE | Admit: 2013-12-30 | Discharge: 2013-12-30 | Disposition: A | Discharge status: Home / Self Care | Payer: Medicaid Other | Source: Ambulatory Visit | Attending: Obstetrics & Gynecology | Admitting: Obstetrics & Gynecology

## 2013-12-30 LAB — GLUCOSE, 2 HOUR: GLUCOSE, 2 HOUR: 92 mg/dL (ref 70–139)

## 2013-12-30 LAB — GLUCOSE, FASTING: GLUCOSE, FASTING: 96 mg/dL (ref 70–99)

## 2013-12-30 NOTE — Discharge Summary (Signed)
Physician Discharge Summary  Patient ID: [name] MRN: [id number] DOB/AGE: 31/04/1980 31 y.o.  Admit date: 12/16/2013 Discharge date: 12/30/2013  Admission Diagnoses: Active Problems:   Vasa previa  Discharge Diagnoses:  Active Problems:   Vasa previa   Discharged Condition: stable  Hospital Course: The patient present for an ultrasound to follow-up a possible placenta previa and was diagnosed with a vasa previa.  She was hospitalized for a course of steroids and heightened fetal surveillance.  Fetal monitoring remained reassuring.  Tocodynamometer failed to show uterine activity.  She denied any preterm labor symptoms during the hospital course.  She was reevaluated on the day of discharge by MFM.  A repeat U/S documented a stable cervical length.  Consults: MFM  Significant Diagnostic Studies: U/S  Treatments: steroids  Discharge Exam: Blood pressure 113/69, pulse 94, temperature 98.7 F (37.1 C), temperature source Oral, resp. rate 18, height 5\' 8"  (1.727 m), weight 72.394 kg (159 lb 9.6 oz), last menstrual period 06/03/2013, SpO2 96.00%. General appearance: alert Resp: clear to auscultation bilaterally Cardio: regular rate and rhythm, S1, S2 normal, no murmur, click, rub or gallop GI: soft, non-tender; bowel sounds normal; no masses,  no organomegaly Extremities: extremities normal, atraumatic, no cyanosis or edema  Disposition: 01-Home or Self Care  Discharge Instructions   Discharge activity:    Complete by:  As directed   No strenuous activity     Discharge diet:  No restrictions    Complete by:  As directed      Discontinue IV    Complete by:  As directed      Do not have sex or do anything that might make you have an orgasm    Complete by:  As directed      No sexual activity restrictions    Complete by:  As directed      Notify physician for a general feeling that "something is not right"    Complete by:  As directed      Notify physician for increase  or change in vaginal discharge    Complete by:  As directed      Notify physician for intestinal cramps, with or without diarrhea, sometimes described as "gas pain"    Complete by:  As directed      Notify physician for leaking of fluid    Complete by:  As directed      Notify physician for low, dull backache, unrelieved by heat or Tylenol    Complete by:  As directed      Notify physician for menstrual like cramps    Complete by:  As directed      Notify physician for pelvic pressure    Complete by:  As directed      Notify physician for uterine contractions.  These may be painless and feel like the uterus is tightening or the baby is  "balling up"    Complete by:  As directed      Notify physician for vaginal bleeding    Complete by:  As directed      PRETERM LABOR:  Includes any of the follwing symptoms that occur between 20 - [redacted] weeks gestation.  If these symptoms are not stopped, preterm labor can result in preterm delivery, placing your baby at risk    Complete by:  As directed             Medication List         albuterol 108 (90 BASE) MCG/ACT inhaler  Commonly known as:  PROVENTIL HFA;VENTOLIN HFA  Inhale 2 puffs into the lungs every 6 (six) hours as needed. Asthma     butalbital-acetaminophen-caffeine 50-325-40 MG per tablet  Commonly known as:  FIORICET  Take 1-2 tablets by mouth every 6 (six) hours as needed for headache.     cyclobenzaprine 10 MG tablet  Commonly known as:  FLEXERIL  Take 1 tablet (10 mg total) by mouth 2 (two) times daily as needed for muscle spasms.     Doxylamine-Pyridoxine 10-10 MG Tbec  Take 3 tablets by mouth as directed. Take 1 tablet in am and 2 tablets at bedtime     traMADol 50 MG tablet  Commonly known as:  ULTRAM  Take 1 tablet (50 mg total) by mouth every 6 (six) hours as needed for moderate pain.           Follow-up Information   Follow up with HARPER,CHARLES A, MD. Schedule an appointment as soon as possible for a visit in 1  week.   Specialty:  Obstetrics and Gynecology   Contact information:   [address] [place] [address] [phone]       Signed: Roseanna RainbowJACKSON-MOORE,Jaelin Devincentis A 12/30/2013, 12:42 PM

## 2013-12-30 NOTE — Progress Notes (Signed)
MFM Consultation:  Impression: Single IUP at 29w 2d with Hx of gestational thrombocytopenia, previous child with a complex fetal heart anomaly Active fetus in cephalic presentation Normal amniotic fluid volume TVUS - a posterior / right lateral placenta is noted.  A velamentous cord insertion is noted that courses along the lower uterine segment and internal os.   A funic presentation is noted as well as a vasa previa that course along the cervix.  Cervix is long and closed measuring 4.2cm Patient reports no contractions, bleeding or leaking of fluid  The findings and implications of the study were discussed with the patient.  Recommendations: 1. Weekly TVUS for cervical length 2. Interval growth is due in 2-3 weeks 3. Given patient's proximity to the hospital (<4910min away) and cervical length (confers low risk for SPTB in the next 14 days), she is a candidate for outpatient management. 4. Strict labor precautions 5. Criteria for admission, <2.5cm cervical length or achievement of 32-[redacted] weeks gestational age (whichever comes first). 6. Would give rescue steroids at 32-33 weeks 7. Recommend delivery at 34-35 weeks provided no preterm labor and reassuring cervical length (>3cm). 8. Recommend delivery at 32-34 weeks if cervical length is <2.5cm. 9. Recommend delivery immediately if cervix opens at this gestational age or pPROM occurs.  Time Spent: I spent in excess of 45 minutes in consultation with this patient to review records, evaluate her case, and provide her with an adequate discussion and education.  More than 50% of this time was spent in direct face-to-face counseling. It was a pleasure seeing your patient in the office today.  Thank you for consultation. Please do not hesitate to contact our service for any further questions.   Thank you,  Louann SjogrenJeffrey Morgan Gaynelle Arabianenney   Denney, Louann SjogrenJeffrey Morgan, MD, MS, FACOG Assistant Professor Section of Maternal-Fetal Medicine [facility]

## 2013-12-30 NOTE — Discharge Instructions (Signed)
Placenta Previa   Placenta previa is a condition in pregnant women where the placenta implants in the lower part of the uterus. The placenta either partially or completely covers the opening to the cervix. This is a problem because the baby must pass through the cervix during delivery. There are three types of placenta previa. They include:   1. Marginal placenta previa. The placenta is near the cervix, but does not cover the opening.  2. Partial placenta previa. The placenta covers part of the cervical opening.  3. Complete placenta previa. The placenta covers the entire cervical opening.    Depending on the type of placenta previa, there is a chance the placenta may move into a normal position and no longer cover the cervix as the pregnancy progresses. It is important to keep all prenatal visits with your caregiver.   RISK FACTORS  You may be more likely to develop placenta previa if you:   · Are carrying more than one baby (multiples).    · Have an abnormally shaped uterus.    · Have scars on the lining of the uterus.    · Had previous surgeries involving the uterus, such as a cesarean delivery.    · Have delivered a baby previously.    · Have a history of placenta previa.    · Have smoked or used cocaine during pregnancy.    · Are age 35 or older during pregnancy.    SYMPTOMS  The main symptom of placenta previa is sudden, painless vaginal bleeding during the second half of pregnancy. The amount of bleeding can be light to very heavy. The bleeding may stop on its own, but almost always returns. Cramping, regular contractions, abdominal pain, and lower back pain can also occur with placenta previa.   DIAGNOSIS  Placenta previa can be diagnosed through an ultrasound by finding where the placenta is located. The ultrasound may find placenta previa either during a routine prenatal visit or after vaginal bleeding is noticed. If you are diagnosed with placenta previa, your caregiver may avoid vaginal exams to reduce  the risk of heavy bleeding. There is a chance that placenta previa may not be diagnosed until bleeding occurs during labor.   TREATMENT  Specific treatment depends on:   · How much you are bleeding or if the bleeding has stopped.  · How far along you are in your pregnancy.    · The condition of the baby.    · The location of the baby and placenta.    · The type of placenta previa.    Depending on the factors above, your caregiver may recommend:   · Decreased activity.    · Bed rest at home or in the hospital.  · Pelvic rest. This means no sex, using tampons, douching, pelvic exams, or placing anything into the vagina.  · A blood transfusion to replace maternal blood loss.  · A cesarean delivery if the bleeding is heavy and cannot be controlled or the placenta completely covers the cervix.  · Medication to stop premature labor or mature the fetal lungs if delivery is needed before the pregnancy is full term.    WHEN SHOULD YOU SEEK IMMEDIATE MEDICAL CARE IF YOU ARE SENT HOME WITH PLACENTA PREVIA?  Seek immediate medical care if you show any symptoms of placenta previa. You will need to go to the hospital to get checked immediately. Again, those symptoms are:  · Sudden, painless vaginal bleeding, even a small amount.  · Cramping or regular contractions.  · Lower back or abdominal pain.  Document Released: 02/24/2005   Document Revised: 10/27/2012 Document Reviewed: 05/28/2012  Exit[employer]® Patient Information ©2015 [employer]. This information is not intended to replace advice given to you by your health care provider. Make sure you discuss any questions you have with your health care provider.

## 2013-12-30 NOTE — Progress Notes (Signed)
Patient understands diagnosis and she is ready for discharge she will return to the hospital with any uc, bleeding, uncontrolled pain, or decreased fetal movement.

## 2014-01-02 ENCOUNTER — Other Ambulatory Visit (HOSPITAL_COMMUNITY): Payer: Self-pay | Admitting: Obstetrics and Gynecology

## 2014-01-02 DIAGNOSIS — O99119 Other diseases of the blood and blood-forming organs and certain disorders involving the immune mechanism complicating pregnancy, unspecified trimester: Secondary | ICD-10-CM

## 2014-01-02 DIAGNOSIS — O352XX0 Maternal care for (suspected) hereditary disease in fetus, not applicable or unspecified: Secondary | ICD-10-CM

## 2014-01-02 DIAGNOSIS — O34219 Maternal care for unspecified type scar from previous cesarean delivery: Secondary | ICD-10-CM

## 2014-01-02 DIAGNOSIS — D696 Thrombocytopenia, unspecified: Secondary | ICD-10-CM

## 2014-01-06 ENCOUNTER — Encounter (HOSPITAL_COMMUNITY): Payer: Self-pay

## 2014-01-06 ENCOUNTER — Ambulatory Visit (HOSPITAL_COMMUNITY)
Admit: 2014-01-06 | Discharge: 2014-01-06 | Disposition: A | Discharge status: Home / Self Care | Payer: Medicaid Other | Attending: Obstetrics | Admitting: Obstetrics

## 2014-01-06 DIAGNOSIS — O34219 Maternal care for unspecified type scar from previous cesarean delivery: Secondary | ICD-10-CM

## 2014-01-06 DIAGNOSIS — D696 Thrombocytopenia, unspecified: Secondary | ICD-10-CM | POA: Diagnosis not present

## 2014-01-06 DIAGNOSIS — Z3A30 30 weeks gestation of pregnancy: Secondary | ICD-10-CM | POA: Diagnosis not present

## 2014-01-06 DIAGNOSIS — O3421 Maternal care for scar from previous cesarean delivery: Secondary | ICD-10-CM | POA: Insufficient documentation

## 2014-01-06 DIAGNOSIS — O352XX0 Maternal care for (suspected) hereditary disease in fetus, not applicable or unspecified: Secondary | ICD-10-CM | POA: Insufficient documentation

## 2014-01-06 DIAGNOSIS — O99113 Other diseases of the blood and blood-forming organs and certain disorders involving the immune mechanism complicating pregnancy, third trimester: Secondary | ICD-10-CM | POA: Insufficient documentation

## 2014-01-06 DIAGNOSIS — O99119 Other diseases of the blood and blood-forming organs and certain disorders involving the immune mechanism complicating pregnancy, unspecified trimester: Secondary | ICD-10-CM

## 2014-01-09 ENCOUNTER — Encounter (HOSPITAL_COMMUNITY): Payer: Self-pay

## 2014-01-12 ENCOUNTER — Inpatient Hospital Stay (HOSPITAL_COMMUNITY)
Admission: AD | Admit: 2014-01-12 | Discharge: 2014-02-11 | DRG: 765 | Disposition: A | Discharge status: Home / Self Care | Payer: Medicaid Other | Source: Ambulatory Visit | Attending: Obstetrics | Admitting: Obstetrics

## 2014-01-12 ENCOUNTER — Inpatient Hospital Stay (HOSPITAL_COMMUNITY): Payer: Medicaid Other

## 2014-01-12 ENCOUNTER — Encounter (HOSPITAL_COMMUNITY): Payer: Self-pay | Admitting: General Practice

## 2014-01-12 DIAGNOSIS — Z3A31 31 weeks gestation of pregnancy: Secondary | ICD-10-CM | POA: Insufficient documentation

## 2014-01-12 DIAGNOSIS — O365930 Maternal care for other known or suspected poor fetal growth, third trimester, not applicable or unspecified: Secondary | ICD-10-CM | POA: Diagnosis present

## 2014-01-12 DIAGNOSIS — O99354 Diseases of the nervous system complicating childbirth: Secondary | ICD-10-CM | POA: Diagnosis present

## 2014-01-12 DIAGNOSIS — O352XX0 Maternal care for (suspected) hereditary disease in fetus, not applicable or unspecified: Secondary | ICD-10-CM

## 2014-01-12 DIAGNOSIS — O288 Other abnormal findings on antenatal screening of mother: Secondary | ICD-10-CM | POA: Insufficient documentation

## 2014-01-12 DIAGNOSIS — O9952 Diseases of the respiratory system complicating childbirth: Secondary | ICD-10-CM | POA: Diagnosis present

## 2014-01-12 DIAGNOSIS — N858 Other specified noninflammatory disorders of uterus: Secondary | ICD-10-CM | POA: Diagnosis present

## 2014-01-12 DIAGNOSIS — K219 Gastro-esophageal reflux disease without esophagitis: Secondary | ICD-10-CM | POA: Diagnosis present

## 2014-01-12 DIAGNOSIS — O9902 Anemia complicating childbirth: Secondary | ICD-10-CM | POA: Diagnosis present

## 2014-01-12 DIAGNOSIS — O26899 Other specified pregnancy related conditions, unspecified trimester: Secondary | ICD-10-CM | POA: Insufficient documentation

## 2014-01-12 DIAGNOSIS — O34219 Maternal care for unspecified type scar from previous cesarean delivery: Secondary | ICD-10-CM

## 2014-01-12 DIAGNOSIS — R51 Headache: Secondary | ICD-10-CM | POA: Diagnosis present

## 2014-01-12 DIAGNOSIS — O9962 Diseases of the digestive system complicating childbirth: Secondary | ICD-10-CM | POA: Diagnosis present

## 2014-01-12 DIAGNOSIS — R109 Unspecified abdominal pain: Secondary | ICD-10-CM | POA: Diagnosis present

## 2014-01-12 DIAGNOSIS — O9989 Other specified diseases and conditions complicating pregnancy, childbirth and the puerperium: Secondary | ICD-10-CM | POA: Diagnosis present

## 2014-01-12 DIAGNOSIS — O3421 Maternal care for scar from previous cesarean delivery: Secondary | ICD-10-CM | POA: Diagnosis present

## 2014-01-12 DIAGNOSIS — O99119 Other diseases of the blood and blood-forming organs and certain disorders involving the immune mechanism complicating pregnancy, unspecified trimester: Secondary | ICD-10-CM

## 2014-01-12 DIAGNOSIS — O9942 Diseases of the circulatory system complicating childbirth: Secondary | ICD-10-CM | POA: Diagnosis present

## 2014-01-12 DIAGNOSIS — R011 Cardiac murmur, unspecified: Secondary | ICD-10-CM | POA: Diagnosis present

## 2014-01-12 DIAGNOSIS — Z3A34 34 weeks gestation of pregnancy: Secondary | ICD-10-CM | POA: Insufficient documentation

## 2014-01-12 DIAGNOSIS — O479 False labor, unspecified: Secondary | ICD-10-CM

## 2014-01-12 DIAGNOSIS — Z3A32 32 weeks gestation of pregnancy: Secondary | ICD-10-CM | POA: Insufficient documentation

## 2014-01-12 DIAGNOSIS — O99019 Anemia complicating pregnancy, unspecified trimester: Secondary | ICD-10-CM | POA: Diagnosis present

## 2014-01-12 DIAGNOSIS — J45909 Unspecified asthma, uncomplicated: Secondary | ICD-10-CM | POA: Diagnosis present

## 2014-01-12 DIAGNOSIS — D649 Anemia, unspecified: Secondary | ICD-10-CM | POA: Diagnosis present

## 2014-01-12 DIAGNOSIS — Z98891 History of uterine scar from previous surgery: Secondary | ICD-10-CM

## 2014-01-12 DIAGNOSIS — D696 Thrombocytopenia, unspecified: Secondary | ICD-10-CM

## 2014-01-12 LAB — URINALYSIS, ROUTINE W REFLEX MICROSCOPIC
BILIRUBIN URINE: NEGATIVE
Glucose, UA: NEGATIVE mg/dL
Hgb urine dipstick: NEGATIVE
Ketones, ur: NEGATIVE mg/dL
Nitrite: NEGATIVE
PH: 5.5 (ref 5.0–8.0)
Protein, ur: NEGATIVE mg/dL
Specific Gravity, Urine: 1.025 (ref 1.005–1.030)
UROBILINOGEN UA: 1 mg/dL (ref 0.0–1.0)

## 2014-01-12 LAB — URINE MICROSCOPIC-ADD ON

## 2014-01-12 MED ORDER — MAGNESIUM SULFATE BOLUS VIA INFUSION
4.0000 g | Freq: Once | INTRAVENOUS | Status: AC
  Administered 2014-01-12: 4 g via INTRAVENOUS
  Filled 2014-01-12: qty 500

## 2014-01-12 MED ORDER — MAGNESIUM SULFATE 40 G IN LACTATED RINGERS - SIMPLE
2.0000 g/h | INTRAVENOUS | Status: DC
  Filled 2014-01-12: qty 500

## 2014-01-12 MED ORDER — LACTATED RINGERS IV BOLUS (SEPSIS)
1000.0000 mL | Freq: Once | INTRAVENOUS | Status: AC
  Administered 2014-01-12: 1000 mL via INTRAVENOUS

## 2014-01-12 MED ORDER — PRENATAL MULTIVITAMIN CH
1.0000 | ORAL_TABLET | Freq: Every day | ORAL | Status: DC
Start: 2014-01-13 — End: 2014-02-08
  Administered 2014-01-13 – 2014-02-07 (×26): 1 via ORAL
  Filled 2014-01-12 (×26): qty 1

## 2014-01-12 MED ORDER — DOCUSATE SODIUM 100 MG PO CAPS
100.0000 mg | ORAL_CAPSULE | Freq: Every day | ORAL | Status: DC
  Administered 2014-01-13 – 2014-02-07 (×23): 100 mg via ORAL
  Filled 2014-01-12 (×24): qty 1

## 2014-01-12 MED ORDER — ACETAMINOPHEN 325 MG PO TABS
650.0000 mg | ORAL_TABLET | ORAL | Status: DC | PRN
  Administered 2014-01-14 – 2014-01-24 (×2): 650 mg via ORAL
  Filled 2014-01-12 (×2): qty 2

## 2014-01-12 MED ORDER — ZOLPIDEM TARTRATE 5 MG PO TABS
5.0000 mg | ORAL_TABLET | Freq: Every evening | ORAL | Status: DC | PRN
  Administered 2014-02-07: 5 mg via ORAL
  Filled 2014-01-12: qty 1

## 2014-01-12 MED ORDER — LACTATED RINGERS IV SOLN
INTRAVENOUS | Status: DC
  Administered 2014-01-12 – 2014-01-13 (×3): via INTRAVENOUS

## 2014-01-12 MED ORDER — CALCIUM CARBONATE ANTACID 500 MG PO CHEW
2.0000 | CHEWABLE_TABLET | ORAL | Status: DC | PRN
  Administered 2014-01-28: 400 mg via ORAL
  Filled 2014-01-12: qty 1

## 2014-01-12 NOTE — MAU Provider Note (Signed)
History     CSN: [id number]  Arrival date and time: 01/12/14 1613   First Provider Initiated Contact with Patient 01/12/14 1706      Chief Complaint  Patient presents with  . Abdominal Pain   HPI  Colonel BaldSandra Zingg is a 33 y.o. [id number] at 5757w0d who presents today with abdominal pain. She states that she has had the pain for a couple of days. She denies any vaginal bleeding or LOF. She states that the fetus has been active. She has an US scheduled for tomorrow.   Past Medical History  Diagnosis Date  . Asthma   . GERD (gastroesophageal reflux disease)   . Allergy   . Heart murmur   . Headache(784.0)   . Anemia     Past Surgical History  Procedure Laterality Date  . Cesarean section      Family History  Problem Relation Age of Onset  . Diabetes Other   . Cancer Other   . Cancer Mother   . Heart defect Daughter   . Heart disease Maternal Grandmother   . Arthritis Maternal Grandmother   . Cancer Maternal Grandmother   . Diabetes Maternal Grandmother   . Hyperlipidemia Maternal Grandmother   . Depression Sister   . COPD Maternal Grandfather   . Arthritis Paternal Grandmother     History  Substance Use Topics  . Smoking status: Never Smoker   . Smokeless tobacco: Never Used  . Alcohol Use: No    Allergies:  Allergies  Allergen Reactions  . Shrimp [Shellfish Allergy] Anaphylaxis  . Iodinated Diagnostic Agents     Unknown    Prescriptions prior to admission  Medication Sig Dispense Refill Last Dose  . albuterol (PROVENTIL HFA;VENTOLIN HFA) 108 (90 BASE) MCG/ACT inhaler Inhale 2 puffs into the lungs every 6 (six) hours as needed. Asthma    Taking  . butalbital-acetaminophen-caffeine (FIORICET) 50-325-40 MG per tablet Take 1-2 tablets by mouth every 6 (six) hours as needed for headache. 20 tablet 0 Taking  . cyclobenzaprine (FLEXERIL) 10 MG tablet Take 1 tablet (10 mg total) by mouth 2 (two) times daily as needed for muscle spasms. 20 tablet 0 Taking  .  Doxylamine-Pyridoxine 10-10 MG TBEC Take 3 tablets by mouth as directed. Take 1 tablet in am and 2 tablets at bedtime   Taking  . traMADol (ULTRAM) 50 MG tablet Take 1 tablet (50 mg total) by mouth every 6 (six) hours as needed for moderate pain. 30 tablet 2 Taking    ROS Physical Exam   Blood pressure 106/72, pulse 100, temperature 97.9 F (36.6 C), temperature source Oral, resp. rate 16, height 5\' 10"  (1.778 m), weight 74.208 kg (163 lb 9.6 oz), last menstrual period 06/03/2013, SpO2 100 %.  Physical Exam  MAU Course  Procedures  Results for orders placed or performed during the hospital encounter of 01/12/14 (from the past 24 hour(s))  Urinalysis, Routine w reflex microscopic     Status: Abnormal   Collection Time: 01/12/14  4:54 PM  Result Value Ref Range   Color, Urine YELLOW YELLOW   APPearance CLOUDY (A) CLEAR   Specific Gravity, Urine 1.025 1.005 - 1.030   pH 5.5 5.0 - 8.0   Glucose, UA NEGATIVE NEGATIVE mg/dL   Hgb urine dipstick NEGATIVE NEGATIVE   Bilirubin Urine NEGATIVE NEGATIVE   Ketones, ur NEGATIVE NEGATIVE mg/dL   Protein, ur NEGATIVE NEGATIVE mg/dL   Urobilinogen, UA 1.0 0.0 - 1.0 mg/dL   Nitrite NEGATIVE NEGATIVE   Leukocytes, UA  MODERATE (A) NEGATIVE  Urine microscopic-add on     Status: Abnormal   Collection Time: 01/12/14  4:54 PM  Result Value Ref Range   Squamous Epithelial / LPF MANY (A) RARE   WBC, UA 11-20 <3 WBC/hpf   RBC / HPF 7-10 <3 RBC/hpf   Bacteria, UA MANY (A) RARE   Urine-Other MUCOUS PRESENT    BPP 8/8 Cervical length: 4.8 cm  1857: D/W Dr. Gaynell FaceMarshall, he would like the patient to be admitted. Will call the NICU and verify this is ok with them.  1859: Dr. Gaynell FaceMarshall ok'd by NICU for admit.   Assessment and Plan   1. Abdominal pain affecting pregnancy, antepartum   2. Vasa previa   3. Non-reactive NST (non-stress test)    Admit to antenatal Magnesium continuous fetal monitoring   Tawnya CrookHogan, Loetta Connelley Donovan 01/12/2014, 5:10 PM

## 2014-01-12 NOTE — MAU Note (Signed)
Patient states she has been having abdominal pain with irregular contractions for a couple of days. Denies bleeding or leaking and reports good fetal movement.  Patient has a known vasaprevia.

## 2014-01-13 ENCOUNTER — Ambulatory Visit (HOSPITAL_COMMUNITY): Payer: Medicaid Other

## 2014-01-13 LAB — CBC
HEMATOCRIT: 24.2 % — AB (ref 36.0–46.0)
HEMOGLOBIN: 8.3 g/dL — AB (ref 12.0–15.0)
MCH: 27.2 pg (ref 26.0–34.0)
MCHC: 34.3 g/dL (ref 30.0–36.0)
MCV: 79.3 fL (ref 78.0–100.0)
Platelets: 121 10*3/uL — ABNORMAL LOW (ref 150–400)
RBC: 3.05 MIL/uL — ABNORMAL LOW (ref 3.87–5.11)
RDW: 15.3 % (ref 11.5–15.5)
WBC: 6.9 10*3/uL (ref 4.0–10.5)

## 2014-01-13 LAB — TYPE AND SCREEN
ABO/RH(D): O POS
ANTIBODY SCREEN: NEGATIVE

## 2014-01-13 NOTE — H&P (Signed)
[name] is a 33 y.o. female presenting for UC's.  Patient has history of vasa previa. Maternal Medical History:  Reason for admission: Contractions.   Prenatal complications: Placental abnormality.   Prenatal Complications - Diabetes: none.    OB History    Gravida Para Term Preterm AB TAB SAB Ectopic Multiple Living   4 2 2  1 1    2      Past Medical History  Diagnosis Date  . Asthma   . GERD (gastroesophageal reflux disease)   . Allergy   . Heart murmur   . Headache(784.0)   . Anemia    Past Surgical History  Procedure Laterality Date  . Cesarean section     Family History: family history includes Arthritis in her maternal grandmother and paternal grandmother; COPD in her maternal grandfather; Cancer in her maternal grandmother, mother, and other; Depression in her sister; Diabetes in her maternal grandmother and other; Heart defect in her daughter; Heart disease in her maternal grandmother; Hyperlipidemia in her maternal grandmother. Social History:  reports that she has never smoked. She has never used smokeless tobacco. She reports that she does not drink alcohol or use illicit drugs.   Prenatal Transfer Tool  Maternal Diabetes: No Genetic Screening: Normal Maternal Ultrasounds/Referrals: Abnormal:  Findings:   Other: Vasa Previa Fetal Ultrasounds or other Referrals:  Referred to Materal Fetal Medicine  Maternal Substance Abuse:  No Significant Maternal Medications:  None Significant Maternal Lab Results:  None Other Comments:  None  Review of Systems  All other systems reviewed and are negative.     Blood pressure 116/64, pulse 87, temperature 98.2 F (36.8 C), temperature source Oral, resp. rate 16, height 5' 8.5" (1.74 m), weight 163 lb (73.936 kg), last menstrual period 06/03/2013, SpO2 99 %. Maternal Exam:  Abdomen: Patient reports no abdominal tenderness.   Physical Exam  Nursing note and vitals reviewed. Constitutional: She is oriented to  person, place, and time. She appears well-developed and well-nourished.  HENT:  Head: Normocephalic and atraumatic.  Eyes: Conjunctivae are normal. Pupils are equal, round, and reactive to light.  Neck: Normal range of motion. Neck supple.  Cardiovascular: Normal rate and regular rhythm.   Respiratory: Effort normal and breath sounds normal.  GI: Soft.  Neurological: She is alert and oriented to person, place, and time.  Skin: Skin is warm and dry.  Psychiatric: She has a normal mood and affect. Her behavior is normal. Judgment and thought content normal.    Prenatal labs: ABO, Rh: --/--/O POS (11/06 0549) Antibody: NEG (11/06 0549) Rubella: 5.93 (06/29 1339) RPR: NON REAC (06/29 1339)  HBsAg: NEGATIVE (06/29 1339)  HIV: NONREACTIVE (06/29 1339)  GBS:     Assessment/Plan: 31 weeks.  Vasa Previa.  Preterm UC's.  Admit.  Magnesium sulfate tocolysis and CP prophylaxis.  Bedrest in hospital until delivery by C/S.   HARPER,CHARLES A 01/13/2014, 12:29 PM

## 2014-01-13 NOTE — Progress Notes (Signed)
Patient ID: Colonel BaldSandra Bertagnolli, female   DOB: 01/07/1981, 33 y.o.   MRN: [id number] Hospital Day: 2  S: Preterm labor symptoms: UC's.  O: Blood pressure 116/64, pulse 87, temperature 98.2 F (36.8 C), temperature source Oral, resp. rate 16, height 5' 8.5" (1.74 m), weight 163 lb (73.936 kg), last menstrual period 06/03/2013, SpO2 99 %.   YQM:VHQIONGEFHT:Baseline: 120-130 bpm Toco: occasional UC SVE: No cervical exams - Vasa Previa  A/P- 33 y.o. admitted with:  UC's.  H/O vasa previa.  Magnesium sulfate started along with IVF's.  Essentially no UC's after magnesium sulfate.  Continue magnesium sulfate x 12 hours for CP prophylaxis.  Will consider Procardia after stopping magnesium sulfate.  Present on Admission:  . Vasa previa  Pregnancy Complications: preterm labor  and vasa previa  Preterm labor management: bedrest advised and pelvic rest advised Dating:  6441w1d PNL Needed:  none FWB:  good PTL:  stable ROD: C-section without labor

## 2014-01-14 DIAGNOSIS — O99019 Anemia complicating pregnancy, unspecified trimester: Secondary | ICD-10-CM | POA: Diagnosis present

## 2014-01-14 LAB — COMPREHENSIVE METABOLIC PANEL
ALK PHOS: 63 U/L (ref 39–117)
ALT: 8 U/L (ref 0–35)
ANION GAP: 11 (ref 5–15)
AST: 17 U/L (ref 0–37)
Albumin: 2.2 g/dL — ABNORMAL LOW (ref 3.5–5.2)
BILIRUBIN TOTAL: 0.5 mg/dL (ref 0.3–1.2)
BUN: 4 mg/dL — ABNORMAL LOW (ref 6–23)
CO2: 21 meq/L (ref 19–32)
Calcium: 7.5 mg/dL — ABNORMAL LOW (ref 8.4–10.5)
Chloride: 104 mEq/L (ref 96–112)
Creatinine, Ser: 0.52 mg/dL (ref 0.50–1.10)
GFR calc non Af Amer: >90 mL/min (ref >90)
GLUCOSE: 83 mg/dL (ref 70–99)
POTASSIUM: 3.5 meq/L — AB (ref 3.7–5.3)
Sodium: 136 mEq/L — ABNORMAL LOW (ref 137–147)
Total Protein: 5.5 g/dL — ABNORMAL LOW (ref 6.0–8.3)

## 2014-01-14 LAB — RETICULOCYTES
RBC.: 3.17 MIL/uL — ABNORMAL LOW (ref 3.87–5.11)
Retic Count, Absolute: 57.1 10*3/uL (ref 19.0–186.0)
Retic Ct Pct: 1.8 % (ref 0.4–3.1)

## 2014-01-14 LAB — LACTATE DEHYDROGENASE: LDH: 173 U/L (ref 94–250)

## 2014-01-14 MED ORDER — FERROUS SULFATE 325 (65 FE) MG PO TABS
325.0000 mg | ORAL_TABLET | Freq: Two times a day (BID) | ORAL | Status: DC
  Administered 2014-01-14 – 2014-02-07 (×49): 325 mg via ORAL
  Filled 2014-01-14 (×49): qty 1

## 2014-01-14 MED ORDER — SODIUM CHLORIDE 0.9 % IJ SOLN
3.0000 mL | Freq: Two times a day (BID) | INTRAMUSCULAR | Status: DC
  Administered 2014-01-14 – 2014-02-07 (×44): 3 mL via INTRAVENOUS

## 2014-01-14 MED ORDER — ALBUTEROL SULFATE (2.5 MG/3ML) 0.083% IN NEBU: 3.0000 mL | INHALATION_SOLUTION | Freq: Four times a day (QID) | RESPIRATORY_TRACT | Status: DC | PRN

## 2014-01-14 MED ORDER — SODIUM CHLORIDE 0.9 % IV SOLN: 250.0000 mL | INTRAVENOUS | Status: DC | PRN

## 2014-01-14 MED ORDER — SODIUM CHLORIDE 0.9 % IJ SOLN: 3.0000 mL | INTRAMUSCULAR | Status: DC | PRN

## 2014-01-14 MED ORDER — MAGNESIUM HYDROXIDE 400 MG/5ML PO SUSP
30.0000 mL | Freq: Once | ORAL | Status: AC
  Administered 2014-01-14: 30 mL via ORAL
  Filled 2014-01-14: qty 30

## 2014-01-14 NOTE — Plan of Care (Signed)
Problem: Consults Goal: Antepartum Patient Education Outcome: Completed/Met Date Met:  01/14/14 Goal: Birthing Suites Patient Information Press F2 to bring up selections list  Outcome: Not Applicable Date Met:  05/02/34  Pt not candidate for BS at this time. Goal: Skin Care Protocol Initiated - if Braden Score 18 or less If consults are not indicated, leave blank or document N/A  Outcome: Completed/Met Date Met:  01/14/14 Goal: Diabetes Guidelines per MD order/protocol Outcome: Not Applicable Date Met:  03/02/48 Goal: Lactation Consult Initiated if indicated Outcome: Progressing Pt asking questions regarding How to initiate and continue breastfeeding of preterm infant.  Questions answered and pt reassured  Of a follow-up visit with Lactation consultant. Goal: Neonatologist Consult Outcome: Completed/Met Date Met:  01/14/14 Previous admission Goal: Physical Therapy Consult Outcome: Progressing Goal: NICU Tour Outcome: Progressing Goal: Orientation to unit: [place] (smoking, visitation, chaplain services, helpline)  Outcome: Completed/Met Date Met:  01/14/14  Problem: Phase I Progression Outcomes Goal: Bowel movement every 2 days Outcome: Progressing Laxative order for no BM over 4 days.   Goal: Diabetic Assessment Outcome: Not Progressing Goal: No significant worsening bleeding/cervix change/vag drainage No significant worsening in vaginal bleeding, cervical change, or vaginal drainage.  Outcome: Not Progressing Goal: LOS < 4 days Outcome: Completed/Met Date Met:  01/14/14 Goal: Contractions < 5-6/hour Outcome: Progressing Goal: Maintains reassuring Fetal Heart Rate Outcome: Completed/Met Date Met:  01/14/14 Goal: Pain controlled with appropriate interventions Outcome: Completed/Met Date Met:  01/14/14 Goal: OOB as tolerated unless otherwise ordered Outcome: Completed/Met Date Met:  01/14/14 Goal:  Initial discharge plan identified Outcome: Completed/Met Date Met:  01/14/14 Goal: Hemodynamically stable Outcome: Completed/Met Date Met:  01/14/14  Problem: Phase II Progression Outcomes Goal: Electronic fetal monitoring(Doppler,Continuous,Intermittent) EFM (Doppler, Continuous, Intermittent)  Outcome: Completed/Met Date Met:  01/14/14 Goal: Tolerating diet Outcome: Completed/Met Date Met:  01/14/14 Goal: Progress activity as tolerated unless otherwise ordered Outcome: Progressing Goal: Output > 30 ml/hr or voiding qs Outcome: Completed/Met Date Met:  01/14/14 Goal: Labs/tests as ordered Labs/tests as ordered (Magnesium level, CBG's, CBC, CMET, 24 hr Urine, Amniocentesis, Ultrasound, Other)  Outcome: Completed/Met Date Met:  01/14/14 Goal: Medications as ordered (see description) Medications as ordered (Magnesium Sulfate, Steroids, PO Tocolysis, Antibiotics, Terbutaline Pump, Other)  Outcome: Progressing

## 2014-01-14 NOTE — Plan of Care (Signed)
Problem: Phase II Progression Outcomes Goal: Mattress in use Mattress in use ( Eggcrate, Med/Surg, Regular, Birthing Suite, Other)  Outcome: Progressing Goal: Progress activity as tolerated unless otherwise ordered Outcome: Progressing Patient on bedrest with bathroom privileges.

## 2014-01-14 NOTE — Progress Notes (Signed)
Patient ID: [name], female   DOB: 07/14/1980, 33 y.o.   MRN: [id number] Hospital Day: 3  S: Preterm labor symptoms: none  O: Blood pressure 107/68, pulse 76, temperature 98.6 F (37 C), temperature source Oral, resp. rate 18, height 5' 8.5" (1.74 m), weight 73.936 kg (163 lb), last menstrual period 06/03/2013, SpO2 99 %.   XLK:GMWNUUVOFHT:Baseline: 120-130 bpm Toco: occasional UC SVE: No cervical exams - Vasa Previa  A/P- 33 y.o.   Present on Admission:  . Vasa previa . Anemia during pregnancy  Add FeSO4 BID Rescue steroids/U/S for growth @ 32 weeks; delivery for cervical length < 2.5 cm or cervical dilatation/PPROM Delivery at 34 - 35 weeks  Pregnancy Complications: preterm labor  and vasa previa  Preterm labor management: bedrest advised and pelvic rest advised Dating:  1620w2d PNL Needed: retic/LDH/iron studies FWB:  good PTL:  stable ROD: C-section without labor

## 2014-01-14 NOTE — Progress Notes (Signed)
Acknowledged order for social work consult to assist mother with obtaining car seat.  Met with patient who was pleasant but guarded.  She is married.  Informed that spouse in currently on disability.  Patient states that she is unsure of whether she will be able to obtain car seat for newborn.  Spoke with her regarding the car seat program and the cost involved.  Direct her to request the car seat after she delivers if she is still unable to secure one.   She identified no other concerns at this time.

## 2014-01-14 NOTE — Assessment & Plan Note (Signed)
Rescue steroids/U/S for growth @ 32 weeks; delivery for cervical length < 2.5 cm or cervical dilatation/PPROM Delivery at 34 - 35 weeks

## 2014-01-15 NOTE — Progress Notes (Signed)
Patient ID: [name], female   DOB: [birth year], 33 y.o.   MRN: [id number] Hospital Day: 4  S: Preterm labor symptoms: none  O: Blood pressure 107/59, pulse 85, temperature 98.8 F (37.1 C), temperature source Oral, resp. rate 18, height 5' 8.5" (1.74 m), weight 73.936 kg (163 lb), last menstrual period 06/03/2013, SpO2 99 %.   SXJ:DBZMCEYE: 120-130 bpm Toco: occasional UC SVE: No cervical exams - Vasa Previa  Results for orders placed or performed during the hospital encounter of 01/12/14 (from the past 48 hour(s))  Reticulocytes     Status: Abnormal   Collection Time: 01/14/14  9:15 AM  Result Value Ref Range   Retic Ct Pct 1.8 0.4 - 3.1 %   RBC. 3.17 (L) 3.87 - 5.11 MIL/uL   Retic Count, Manual 57.1 19.0 - 186.0 K/uL  Comprehensive metabolic panel     Status: Abnormal   Collection Time: 01/14/14  9:15 AM  Result Value Ref Range   Sodium 136 (L) 137 - 147 mEq/L   Potassium 3.5 (L) 3.7 - 5.3 mEq/L   Chloride 104 96 - 112 mEq/L   CO2 21 19 - 32 mEq/L   Glucose, Bld 83 70 - 99 mg/dL   BUN 4 (L) 6 - 23 mg/dL   Creatinine, Ser 0.52 0.50 - 1.10 mg/dL   Calcium 7.5 (L) 8.4 - 10.5 mg/dL   Total Protein 5.5 (L) 6.0 - 8.3 g/dL   Albumin 2.2 (L) 3.5 - 5.2 g/dL   AST 17 0 - 37 U/L   ALT 8 0 - 35 U/L   Alkaline Phosphatase 63 39 - 117 U/L   Total Bilirubin 0.5 0.3 - 1.2 mg/dL   GFR calc non Af Amer >90 >90 mL/min   GFR calc Af Amer >90 >90 mL/min    Comment: (NOTE) The eGFR has been calculated using the CKD EPI equation. This calculation has not been validated in all clinical situations. eGFR's persistently <90 mL/min signify possible Chronic Kidney Disease.    Anion gap 11 5 - 15  Lactate dehydrogenase     Status: None   Collection Time: 01/14/14  9:15 AM  Result Value Ref Range   LDH 173 94 - 250 U/L   A/P- 33 y.o.   Present on Admission:  . Vasa previa . Anemia during pregnancy  Add FeSO4 BID Rescue steroids/U/S for growth @ 32 weeks; delivery for cervical length <  2.5 cm or cervical dilatation/PPROM Delivery at 35 - 35 weeks  Pregnancy Complications: preterm labor  and vasa previa  Preterm labor management: bedrest advised and pelvic rest advised Dating:  [redacted]w[redacted]d PNL None FWB:  good PTL:  stable ROD: C-section without labor

## 2014-01-16 LAB — IRON AND TIBC
IRON: 20 ug/dL — AB (ref 42–135)
Saturation Ratios: 5 % — ABNORMAL LOW (ref 20–55)
TIBC: 427 ug/dL (ref 250–470)
UIBC: 407 ug/dL — ABNORMAL HIGH (ref 125–400)

## 2014-01-16 LAB — FERRITIN: Ferritin: 13 ng/mL (ref 10–291)

## 2014-01-16 NOTE — Progress Notes (Signed)
Patient ID: Sherry BaldSandra [name], female   DOB: 09/27/1980, 33 y.o.   MRN: [id number] Hospital Day: 5  S: No complaints.  O: Blood pressure 99/54, pulse 84, temperature 98.5 F (36.9 C), temperature source Oral, resp. rate 18, height 5' 8.5" (1.74 m), weight 163 lb (73.936 kg), last menstrual period 06/03/2013, SpO2 99 %.   OZD:GUYQIHKVFHT:Baseline: 140 bpm Toco: None SVE:   A/P- 33 y.o. admitted with:  Present on Admission:  . Vasa previa . Anemia during pregnancy  Pregnancy Complications: Vasa Previa  Preterm labor management: bedrest advised Dating:  7246w4d PNL Needed:  None FWB:  good PTL:  none ROD: C-section without labor

## 2014-01-16 NOTE — Progress Notes (Signed)
Ur chart review completed.  

## 2014-01-18 NOTE — Progress Notes (Signed)
Patient ID: Colonel BaldSandra Chmiel, female   DOB: 09/06/1980, 33 y.o.   MRN: [id number] Hospital Day: 7    S:  No complaints.                                                   O: Blood pressure 112/54, pulse 101, temperature 98.6 F (37 C), temperature source Oral, resp. rate 20, height 5' 8.5" (1.74 m), weight 163 lb (73.936 kg), last menstrual period 06/03/2013, SpO2 99 %.   WUJ:WJXBJYNWFHT:Baseline: 150 bpm Toco: None SVE:   A/P- 33 y.o. admitted with:  Vasa Previa.  Doing well.  Continue bedrest.  Present on Admission:  . Vasa previa . Anemia during pregnancy  Pregnancy Complications: Vasa Previe  Preterm labor management: bedrest advised Dating:  4440w6d PNL Needed:  none FWB:  good PTL:  none ROD: C-section without labor

## 2014-01-19 ENCOUNTER — Ambulatory Visit (HOSPITAL_COMMUNITY): Admit: 2014-01-19 | Payer: Medicaid Other

## 2014-01-19 ENCOUNTER — Inpatient Hospital Stay (HOSPITAL_COMMUNITY): Payer: Medicaid Other

## 2014-01-19 DIAGNOSIS — Z3A32 32 weeks gestation of pregnancy: Secondary | ICD-10-CM | POA: Insufficient documentation

## 2014-01-19 DIAGNOSIS — O365930 Maternal care for other known or suspected poor fetal growth, third trimester, not applicable or unspecified: Secondary | ICD-10-CM | POA: Insufficient documentation

## 2014-01-19 LAB — PREPARE RBC (CROSSMATCH)

## 2014-01-19 MED ORDER — BETAMETHASONE SOD PHOS & ACET 6 (3-3) MG/ML IJ SUSP
12.0000 mg | INTRAMUSCULAR | Status: AC
  Administered 2014-01-19 – 2014-01-20 (×2): 12 mg via INTRAMUSCULAR
  Filled 2014-01-19 (×2): qty 2

## 2014-01-19 NOTE — Progress Notes (Signed)
UR completed 

## 2014-01-19 NOTE — Progress Notes (Signed)
Antenatal Nutrition Assessment:  Currently  [redacted] weeks gestation, with vasa previa. Height  68.5 "  Weight 165 lbs  pre-pregnancy weight 142 lbs .  Pre-pregnancy  BMI 21.3  IBW 143 lbs Total weight gain 23.lbs Weight gain goals 25-35 lbs Estimated needs: 1800-2000 kcal/day, 71-81 grams protein/day, 2.2 liters fluid/day  Regular diet tolerated well, appetite good.Snack menu provided, double portions allowed Current diet prescription will provide for increased needs.  No abnormal nutrition related labs  Nutrition Dx: Increased nutrient needs r/t pregnancy and fetal growth requirements aeb [redacted] weeks gestation.  No educational needs assessed at this time.  [name] Neonatal Nutrition Support Specialist/RD III Pager [phone]

## 2014-01-19 NOTE — Plan of Care (Signed)
Problem: Consults Goal: Nutrition Consult-if indicated Outcome: Progressing Dietician came and talked with patient regarding meals and snacks.

## 2014-01-19 NOTE — Progress Notes (Signed)
Patient ID: Colonel BaldSandra Jean, female   DOB: 11/15/1980, 33 y.o.   MRN: [id number] Hospital Day: 8  S: No complaints.  O: Blood pressure 111/64, pulse 89, temperature 98 F (36.7 C), temperature source Oral, resp. rate 20, height 5' 8.5" (1.74 m), weight 165 lb 3.2 oz (74.934 kg), last menstrual period 06/03/2013, SpO2 99 %.   WUJ:WJXBJYNWFHT:Baseline: 150 bpm Toco: None SVE:   A/P- 33 y.o. admitted with:  Vasa Previa on ultrasound.  No episodes of bleeding or cramping.  Patient admitted for bedrest until delivery.  Present on Admission:  . Vasa previa . Anemia during pregnancy  Pregnancy Complications: Vasa Previa  Preterm labor management: bedrest advised Dating:  6449w0d PNL Needed:  none FWB:  good PTL:  none ROD: C-section without labor

## 2014-01-20 NOTE — Progress Notes (Signed)
Refusing monitoring at this time wants to wait until she eats

## 2014-01-20 NOTE — Progress Notes (Signed)
Patient ID: [name], female   DOB: 12/25/1980, 33 y.o.   MRN: [id number] Hospital Day: 309  S: No complaints.  O: Blood pressure 104/51, pulse 75, temperature 98.4 F (36.9 C), temperature source Oral, resp. rate 18, height 5' 8.5" (1.74 m), weight 165 lb 3.2 oz (74.934 kg), last menstrual period 06/03/2013, SpO2 99 %.   WUJ:WJXBJYNWFHT:Baseline: 150 bpm Toco: None SVE:   A/P- 33 y.o. admitted with:  Vasa previa on ultrasound.  Admitted for bedrest.  Present on Admission:  . Vasa previa . Anemia during pregnancy  Pregnancy Complications: Vasa Previa  Preterm labor management: bedrest advised Dating:  8978w1d PNL Needed:  none FWB:  good PTL:  none ROD: C-section without labor

## 2014-01-21 NOTE — Progress Notes (Signed)
Patient ID: [name], female   DOB: 02/10/1981, 33 y.o.   MRN: [id number] Hospital Day: 1710  S: No complaints  O: Blood pressure 103/53, pulse 81, temperature 98.1 F (36.7 C), temperature source Oral, resp. rate 18, height 5' 8.5" (1.74 m), weight 165 lb 3.2 oz (74.934 kg), last menstrual period 06/03/2013, SpO2 99 %.   JYN:WGNFAOZHFHT:Baseline: 150 bpm Toco: None SVE:   A/P- [age] y.o. admitted with:  Vasa previa.  Stable.  Continue bedrest.  Present on Admission:  . Vasa previa . Anemia during pregnancy  Pregnancy Complications: Vasa Previa  Preterm labor management: bedrest advised Dating:  6171w2d PNL Needed:  none FWB:  good PTL:  stable ROD: C-section without labor

## 2014-01-23 LAB — TYPE AND SCREEN
ABO/RH(D): O POS
ABO/RH(D): O POS
Antibody Screen: NEGATIVE
Antibody Screen: NEGATIVE
UNIT DIVISION: 0
Unit division: 0

## 2014-01-23 NOTE — Progress Notes (Signed)
Patient ID: Colonel BaldSandra Neidig, female   DOB: 07/13/1980, 33 y.o.   MRN: [id number] Hospital Day: 1712  S: Preterm labor symptoms: No complaints.  O: Blood pressure 107/51, pulse 83, temperature 98.4 F (36.9 C), temperature source Oral, resp. rate 16, height 5' 8.5" (1.74 m), weight 165 lb 3.2 oz (74.934 kg), last menstrual period 06/03/2013, SpO2 99 %.   NWG:NFAOZHYQFHT:Baseline: 150 bpm Toco: None SVE:   A/P- 33 y.o. admitted with:  Vasa Previa on ultrasound.  No bleeding or cramping.  Stable.  Continue bedrest.  Present on Admission:  . Vasa previa . Anemia during pregnancy  Pregnancy Complications: Vasa Previa  Preterm labor management: bedrest advised Dating:  6357w4d PNL Needed:  none FWB:  good PTL:  stable ROD: C-section without labor

## 2014-01-23 NOTE — Progress Notes (Signed)
Ur chart review completed.  

## 2014-01-23 NOTE — Progress Notes (Signed)
Patient ID: [name], female   DOB: 11/28/1980, 33 y.o.   MRN: [id number] Hospital Day: 5612  S: Preterm labor symptoms: No complaints.  O: Blood pressure 107/51, pulse 83, temperature 98.4 F (36.9 C), temperature source Oral, resp. rate 16, height 5' 8.5" (1.74 m), weight 74.934 kg (165 lb 3.2 oz), last menstrual period 06/03/2013, SpO2 99 %.   WUJ:WJXBJYNWFHT:Baseline: 150 bpm Toco: None SVE: Deferred  A/P- 33 y.o.   Present on Admission:  . Vasa previa . Anemia during pregnancy  Pregnancy Complications: Vasa Previa  Preterm labor management: bedrest advised Dating:  425w4d  PNL Needed:  none FWB:  good PTL:  stable ROD: C-section without labor

## 2014-01-24 NOTE — Progress Notes (Signed)
Patient ID: [name], female   DOB: 10/08/1980, 33 y.o.   MRN: [id number] Hospital Day: 2313  S: No complaints.  O: Blood pressure 90/55, pulse 90, temperature 98.9 F (37.2 C), temperature source Oral, resp. rate 20, height 5' 8.5" (1.74 m), weight 165 lb 3.2 oz (74.934 kg), last menstrual period 06/03/2013, SpO2 99 %.   WUJ:WJXBJYNWFHT:Baseline: 135 bpm Toco: None SVE:   A/P- 33 y.o. admitted with:  Present on Admission:  . Vasa previa . Anemia during pregnancy  Pregnancy Complications: Vasa Previa  Preterm labor management: bedrest advised Dating:  6998w5d PNL Needed:  none FWB:  good PTL:  none ROD: C-section without labor for Vasa Previa

## 2014-01-24 NOTE — Plan of Care (Signed)
Problem: Consults Goal: Nutrition Consult-if indicated Outcome: Progressing     

## 2014-01-24 NOTE — Progress Notes (Signed)
Pt requesting that IV-saline lock not be restarted at this time.  Request to be left alone.

## 2014-01-25 NOTE — Progress Notes (Signed)
This was a follow-up visit with Ms [name] whom I saw on an earlier admission.  She was having some pain today and was not feeling up to talking much.  She is eager to be on the other side of this pregnancy and home with her 6912 and 336 year-old children.    We will continue to follow up with her, but please also page as needs arise.  Centex CorporationChaplain Katy Daevon Holdren Pager, [phone] 2:06 PM    01/25/14 1400  Clinical Encounter Type  Visited With Patient  Visit Type Spiritual support  Spiritual Encounters  Spiritual Needs Emotional

## 2014-01-25 NOTE — Progress Notes (Signed)
Patient ID: [name], female   DOB: 05/13/1980, 33 y.o.   MRN: [id number] Hospital Day: 7214  S: No complaints.  O: Blood pressure 93/53, pulse 94, temperature 98.9 F (37.2 C), temperature source Oral, resp. rate 18, height 5' 8.5" (1.74 m), weight 165 lb 3.2 oz (74.934 kg), last menstrual period 06/03/2013, SpO2 99 %.   WUJ:WJXBJYNWFHT:Baseline: 140 bpm Toco: None SVE:   A/P- 33 y.o. admitted with:  Vasa Previa.  Continue bedrest.  Delivery by C/S planned at 34-35 weeks.  Present on Admission:  . Vasa previa . Anemia during pregnancy  Pregnancy Complications: Vasa Previa  Preterm labor management: bedrest advised Dating:  742w6d PNL Needed:  none FWB:  good PTL:  none ROD: C-section without labor

## 2014-01-26 LAB — TYPE AND SCREEN
ABO/RH(D): O POS
Antibody Screen: NEGATIVE

## 2014-01-26 NOTE — Progress Notes (Signed)
UR completed 

## 2014-01-26 NOTE — Progress Notes (Signed)
Patient ID: [name], female   DOB: 12/04/1980, 33 y.o.   MRN: [id number] Hospital Day: 315  S: No complaints.  O: Blood pressure 97/55, pulse 89, temperature 98.6 F (37 C), temperature source Oral, resp. rate 20, height 5' 8.5" (1.74 m), weight 164 lb 3.2 oz (74.481 kg), last menstrual period 06/03/2013, SpO2 99 %.   WUJ:WJXBJYNWFHT:Baseline: 150 bpm Toco: None SVE:   A/P- 33 y.o. admitted with:  Present on Admission:  . Vasa previa . Anemia during pregnancy  Pregnancy Complications: Vasa Previa  Preterm labor management: bedrest advised Dating:  4355w0d PNL Needed:  none FWB:  good PTL:  none ROD: C-section without labor

## 2014-01-27 ENCOUNTER — Ambulatory Visit (HOSPITAL_COMMUNITY)
Admit: 2014-01-27 | Discharge: 2014-01-27 | Disposition: A | Discharge status: Home / Self Care | Payer: Medicaid Other | Attending: Obstetrics and Gynecology | Admitting: Obstetrics and Gynecology

## 2014-01-27 DIAGNOSIS — Z3A33 33 weeks gestation of pregnancy: Secondary | ICD-10-CM | POA: Insufficient documentation

## 2014-01-27 DIAGNOSIS — O352XX0 Maternal care for (suspected) hereditary disease in fetus, not applicable or unspecified: Secondary | ICD-10-CM | POA: Insufficient documentation

## 2014-01-27 DIAGNOSIS — O99119 Other diseases of the blood and blood-forming organs and certain disorders involving the immune mechanism complicating pregnancy, unspecified trimester: Secondary | ICD-10-CM

## 2014-01-27 DIAGNOSIS — O34219 Maternal care for unspecified type scar from previous cesarean delivery: Secondary | ICD-10-CM | POA: Insufficient documentation

## 2014-01-27 DIAGNOSIS — D696 Thrombocytopenia, unspecified: Secondary | ICD-10-CM

## 2014-01-28 NOTE — Progress Notes (Signed)
Patient ID: [name], female   DOB: 10/01/1980, 33 y.o.   MRN: [id number] Vital signs normal Ultrasound yesterday confirms vasa previa No complaints continue present therapy

## 2014-01-29 LAB — TYPE AND SCREEN
ABO/RH(D): O POS
ANTIBODY SCREEN: NEGATIVE

## 2014-01-29 NOTE — Progress Notes (Signed)
Patient ID: [name], female   DOB: 06/22/1980, 33 y.o.   MRN: [id number] No change since yesterday

## 2014-01-30 MED ORDER — FAMOTIDINE 20 MG PO TABS
20.0000 mg | ORAL_TABLET | Freq: Two times a day (BID) | ORAL | Status: DC
  Administered 2014-01-30 – 2014-02-07 (×16): 20 mg via ORAL
  Filled 2014-01-30 (×16): qty 1

## 2014-01-30 MED ORDER — FAMOTIDINE IN NACL 20-0.9 MG/50ML-% IV SOLN
20.0000 mg | Freq: Two times a day (BID) | INTRAVENOUS | Status: DC
  Filled 2014-01-30: qty 50

## 2014-01-30 NOTE — Progress Notes (Signed)
Patient ID: [name], female   DOB: 12/14/1980, 33 y.o.   MRN: [id number] Hospital Day: 819  S: No complaints.  O: Blood pressure 119/64, pulse 93, temperature 98.2 F (36.8 C), temperature source Oral, resp. rate 20, height 5' 8.5" (1.74 m), weight 164 lb 3.2 oz (74.481 kg), last menstrual period 06/03/2013, SpO2 97 %.   WUJ:WJXBJYNWFHT:Baseline: 150 bpm Toco: None SVE:   A/P- 33 y.o. admitted with:  Vasa Previa.  No bleeding or cramping.  Continue bedrest.  Present on Admission:  . Vasa previa . Anemia during pregnancy  Pregnancy Complications: Vasa Previa  Preterm labor management: bedrest advised Dating:  6138w4d PNL Needed:  none FWB:  good PTL:  stable ROD: C-section without labor

## 2014-01-30 NOTE — Progress Notes (Signed)
Ur chart review completed.  

## 2014-01-31 ENCOUNTER — Other Ambulatory Visit: Payer: Self-pay | Admitting: *Deleted

## 2014-02-01 ENCOUNTER — Encounter (HOSPITAL_COMMUNITY): Payer: Self-pay | Admitting: *Deleted

## 2014-02-01 ENCOUNTER — Ambulatory Visit (HOSPITAL_COMMUNITY): Payer: Medicaid Other

## 2014-02-01 LAB — TYPE AND SCREEN
ABO/RH(D): O POS
Antibody Screen: NEGATIVE

## 2014-02-01 NOTE — Progress Notes (Signed)
Patient ID: Colonel BaldSandra Sifuentes, female   DOB: 12/10/1980, 33 y.o.   MRN: [id number] Hospital Day: 7421  S: No complaints  O: Blood pressure 102/62, pulse 96, temperature 97.8 F (36.6 C), temperature source Oral, resp. rate 18, height 5' 8.5" (1.74 m), weight 166 lb (75.297 kg), last menstrual period 06/03/2013, SpO2 97 %.   WUJ:WJXBJYNWFHT:Baseline: 150 bpm Toco: None SVE:   A/P- 33 y.o. admitted with:  Vasa Previa.  Stable.  Continue bedrest.  Present on Admission:  . Vasa previa . Anemia during pregnancy  Pregnancy Complications: Vasa Previa  Preterm labor management: no treatment necessary Dating:  6360w6d PNL Needed:  none FWB:  good PTL:  none ROD: C-section without labor

## 2014-02-02 NOTE — Progress Notes (Signed)
Patient ID: Colonel BaldSandra Osberg, female   DOB: 05/05/1980, 33 y.o.   MRN: [id number] Hospital Day: 2722  S: No complaints.  O: Blood pressure 116/66, pulse 82, temperature 98.4 F (36.9 C), temperature source Oral, resp. rate 20, height 5' 8.5" (1.74 m), weight 166 lb (75.297 kg), last menstrual period 06/03/2013, SpO2 97 %.   WUJ:WJXBJYNWFHT:Baseline: 150 bpm Toco: None SVE:   A/P- 33 y.o. admitted with:  Present on Admission:  . Vasa previa . Anemia during pregnancy  Pregnancy Complications: Vasa Previa  Preterm labor management: no treatment necessary Dating:  4749w0d PNL Needed:  none FWB:  good PTL:  stable ROD: C-section without labor

## 2014-02-03 NOTE — Progress Notes (Signed)
Patient ID: [name], female   DOB: 05/22/1980, 33 y.o.   MRN: [id number] Hospital Day: 1723  S: No complaints.  O: Blood pressure 103/55, pulse 90, temperature 97.8 F (36.6 C), temperature source Oral, resp. rate 18, height 5' 8.5" (1.74 m), weight 166 lb (75.297 kg), last menstrual period 06/03/2013, SpO2 97 %.   WUJ:WJXBJYNWFHT:Baseline: 150 bpm Toco: None SVE:   A/P- 33 y.o. admitted with:  Vasa Previa.  Stable.  Continue bedrest.  Present on Admission:  . Vasa previa . Anemia during pregnancy  Pregnancy Complications: Vasa Previa  Preterm labor management: no treatment necessary Dating:  488w1d PNL Needed:  none FWB:  good PTL:  stable ROD: C-section without labor

## 2014-02-03 NOTE — Progress Notes (Signed)
UR completed 

## 2014-02-04 LAB — TYPE AND SCREEN
ABO/RH(D): O POS
Antibody Screen: NEGATIVE

## 2014-02-04 NOTE — Progress Notes (Signed)
Patient ID: [name], female   DOB: 06/01/1980, 33 y.o.   MRN: [id number] Hospital Day: 3124  S: No complaints.  O: Blood pressure 117/71, pulse 89, temperature 98.1 F (36.7 C), temperature source Oral, resp. rate 16, height 5' 8.5" (1.74 m), weight 166 lb (75.297 kg), last menstrual period 06/03/2013, SpO2 97 %.   AOZ:HYQMVHQIFHT:Baseline: 150 bpm Toco: None SVE:   A/P- 33 y.o. admitted with:  Vasa Previa.  Stable.  Continue bedrest.  Present on Admission:  . Vasa previa . Anemia during pregnancy  Pregnancy Complications: Vasa Previa  Preterm labor management: no treatment necessary Dating:  6882w2d PNL Needed:  none FWB:  good PTL:  stable ROD: C-section without labor

## 2014-02-05 NOTE — Progress Notes (Signed)
Patient ID: [name], female   DOB: 06/28/1980, 33 y.o.   MRN: [id number] Hospital Day: 8225  S: No complaints.  O: Blood pressure 116/61, pulse 90, temperature 98.1 F (36.7 C), temperature source Oral, resp. rate 18, height 5' 8.5" (1.74 m), weight 166 lb (75.297 kg), last menstrual period 06/03/2013, SpO2 99 %.   NWG:NFAOZHYQFHT:Baseline: 150 bpm Toco: None SVE:   A/P- 33 y.o. admitted with:  Vasa Previa.  Stable.  Continue bedrest.  Present on Admission:  . Vasa previa . Anemia during pregnancy  Pregnancy Complications: Vasa Previa  Preterm labor management: no treatment necessary Dating:  3989w3d PNL Needed:  none FWB:  good PTL:  stable ROD: C-section without labor

## 2014-02-06 ENCOUNTER — Inpatient Hospital Stay (HOSPITAL_COMMUNITY): Payer: Medicaid Other

## 2014-02-06 DIAGNOSIS — O479 False labor, unspecified: Secondary | ICD-10-CM | POA: Insufficient documentation

## 2014-02-06 DIAGNOSIS — Z3A34 34 weeks gestation of pregnancy: Secondary | ICD-10-CM | POA: Insufficient documentation

## 2014-02-06 NOTE — Progress Notes (Signed)
Patient ID: [name], female   DOB: 07/15/1980, 33 y.o.   MRN: [id number] Hospital Day: 7426  S: No complaints.  O: Blood pressure 112/56, pulse 96, temperature 98.9 F (37.2 C), temperature source Oral, resp. rate 16, height 5' 8.5" (1.74 m), weight 75.297 kg (166 lb), last menstrual period 06/03/2013, SpO2 99 %.   WUJ:WJXBJYNWFHT:Baseline: 150 bpm Toco: None SVE:  Deferred  A/P- 33 y.o.   Present on Admission:  . Vasa previa . Anemia during pregnancy  Pregnancy Complications: Vasa Previa  Preterm labor management: no treatment necessary Dating:  5557w4d  PNL Needed:  none FWB:  good PTL:  stable ROD: C-section without labor

## 2014-02-07 NOTE — Progress Notes (Signed)
UR completed 

## 2014-02-07 NOTE — Consult Note (Signed)
[facility]  [facility] 02[facility]07/2014    10:50 PM  Neonatal Medicine Consultation         [name]          MRN:  [id number]  I was called at the request of the patient's obstetrician (Dr. Clearance CootsHarper) to speak to this patient due to impending preterm delivery at 8134 6/7 weeks (tomorrow morning).  The patient's prenatal course includes vasa previa.  She is 34 5/7 weeks currently.  She is admitted to antenatal unit in early November, and received a course of betamethasone.  The baby is a female.  I reviewed expectations for a baby born at about 35 weeks, including survival, length of stay, morbidities such as respiratory distress and feeding difficulties.  I described how we provide respiratory and feeding support.  Mom plans to breast feed.  We would expect the baby to do well, remaining in the NICU for a week or two most likely.    I spent 20 minutes reviewing the record, speaking to the patient, and entering appropriate documentation.  More than 50% of the time was spent face to face with patient.   _____________________ Electronically Signed By: Angelita InglesMcCrae S. Apoorva Bugay, MD Neonatologist

## 2014-02-07 NOTE — Progress Notes (Signed)
Patient ID: Colonel BaldSandra Leaver, female   DOB: 06/15/1980, 33 y.o.   MRN: [id number] Hospital Day: 5327  S: No complaints.  O: Blood pressure 121/73, pulse 105, temperature 98.6 F (37 C), temperature source Axillary, resp. rate 18, height 5' 8.5" (1.74 m), weight 166 lb (75.297 kg), last menstrual period 06/03/2013, SpO2 99 %.   WUJ:WJXBJYNWFHT:Baseline: 150 bpm Toco: None SVE:   A/P- 33 y.o. admitted with:  Vasa Previa.  Stable.  Continue bedrest.  Present on Admission:  . Vasa previa . Anemia during pregnancy  Pregnancy Complications: Vasa Previa  Preterm labor management: no treatment necessary Dating:  2717w5d PNL Needed:  none FWB:  good PTL:  stable ROD: C-section without labor

## 2014-02-08 ENCOUNTER — Inpatient Hospital Stay (HOSPITAL_COMMUNITY): Payer: Medicaid Other | Admitting: Anesthesiology

## 2014-02-08 ENCOUNTER — Encounter (HOSPITAL_COMMUNITY)
Admission: AD | Disposition: A | Discharge status: Home / Self Care | Payer: Self-pay | Source: Ambulatory Visit | Attending: Obstetrics

## 2014-02-08 ENCOUNTER — Encounter (HOSPITAL_COMMUNITY): Payer: Self-pay | Admitting: Anesthesiology

## 2014-02-08 DIAGNOSIS — Z98891 History of uterine scar from previous surgery: Secondary | ICD-10-CM

## 2014-02-08 LAB — PREPARE RBC (CROSSMATCH)

## 2014-02-08 LAB — CBC
HEMATOCRIT: 31.4 % — AB (ref 36.0–46.0)
Hemoglobin: 10.6 g/dL — ABNORMAL LOW (ref 12.0–15.0)
MCH: 28.8 pg (ref 26.0–34.0)
MCHC: 33.8 g/dL (ref 30.0–36.0)
MCV: 85.3 fL (ref 78.0–100.0)
Platelets: 105 10*3/uL — ABNORMAL LOW (ref 150–400)
RBC: 3.68 MIL/uL — ABNORMAL LOW (ref 3.87–5.11)
RDW: 20.9 % — AB (ref 11.5–15.5)
WBC: 7.2 10*3/uL (ref 4.0–10.5)

## 2014-02-08 LAB — MRSA PCR SCREENING: MRSA BY PCR: INVALID — AB

## 2014-02-08 SURGERY — Surgical Case
Anesthesia: Spinal | Site: Abdomen

## 2014-02-08 MED ORDER — ONDANSETRON HCL 4 MG/2ML IJ SOLN
4.0000 mg | INTRAMUSCULAR | Status: DC | PRN
  Administered 2014-02-08: 4 mg via INTRAVENOUS
  Filled 2014-02-08: qty 2

## 2014-02-08 MED ORDER — NALBUPHINE HCL 10 MG/ML IJ SOLN: 5.0000 mg | INTRAMUSCULAR | Status: DC | PRN

## 2014-02-08 MED ORDER — DIBUCAINE 1 % RE OINT: 1.0000 "application " | TOPICAL_OINTMENT | RECTAL | Status: DC | PRN

## 2014-02-08 MED ORDER — SCOPOLAMINE 1 MG/3DAYS TD PT72
1.0000 | MEDICATED_PATCH | Freq: Once | TRANSDERMAL | Status: DC
  Filled 2014-02-08: qty 1

## 2014-02-08 MED ORDER — LACTATED RINGERS IV SOLN
INTRAVENOUS | Status: DC | PRN
  Administered 2014-02-08: 14:00:00 via INTRAVENOUS

## 2014-02-08 MED ORDER — SENNOSIDES-DOCUSATE SODIUM 8.6-50 MG PO TABS
2.0000 | ORAL_TABLET | ORAL | Status: DC
  Administered 2014-02-10 – 2014-02-11 (×2): 2 via ORAL
  Filled 2014-02-08 (×3): qty 2

## 2014-02-08 MED ORDER — FENTANYL CITRATE 0.05 MG/ML IJ SOLN
INTRAMUSCULAR | Status: DC | PRN
  Administered 2014-02-08: 25 ug via INTRATHECAL

## 2014-02-08 MED ORDER — PRENATAL MULTIVITAMIN CH
1.0000 | ORAL_TABLET | Freq: Every day | ORAL | Status: DC
  Administered 2014-02-09 – 2014-02-11 (×3): 1 via ORAL
  Filled 2014-02-08 (×3): qty 1

## 2014-02-08 MED ORDER — MORPHINE SULFATE 0.5 MG/ML IJ SOLN
INTRAMUSCULAR | Status: AC
  Filled 2014-02-08: qty 10

## 2014-02-08 MED ORDER — PHENYLEPHRINE 40 MCG/ML (10ML) SYRINGE FOR IV PUSH (FOR BLOOD PRESSURE SUPPORT)
PREFILLED_SYRINGE | INTRAVENOUS | Status: AC
  Filled 2014-02-08: qty 5

## 2014-02-08 MED ORDER — KETOROLAC TROMETHAMINE 30 MG/ML IJ SOLN: 30.0000 mg | Freq: Four times a day (QID) | INTRAMUSCULAR | Status: DC | PRN

## 2014-02-08 MED ORDER — DIPHENHYDRAMINE HCL 25 MG PO CAPS: 25.0000 mg | ORAL_CAPSULE | ORAL | Status: DC | PRN

## 2014-02-08 MED ORDER — LACTATED RINGERS IV SOLN
INTRAVENOUS | Status: DC | PRN
  Administered 2014-02-08 (×3): via INTRAVENOUS

## 2014-02-08 MED ORDER — IBUPROFEN 600 MG PO TABS
600.0000 mg | ORAL_TABLET | Freq: Four times a day (QID) | ORAL | Status: DC
  Administered 2014-02-09 – 2014-02-11 (×10): 600 mg via ORAL
  Filled 2014-02-08 (×11): qty 1

## 2014-02-08 MED ORDER — BACITRACIN-NEOMYCIN-POLYMYXIN 400-5-5000 EX OINT
TOPICAL_OINTMENT | CUTANEOUS | Status: AC
  Filled 2014-02-08: qty 1

## 2014-02-08 MED ORDER — FENTANYL CITRATE 0.05 MG/ML IJ SOLN
25.0000 ug | INTRAMUSCULAR | Status: DC | PRN
  Administered 2014-02-08 (×2): 25 ug via INTRAVENOUS

## 2014-02-08 MED ORDER — BUPIVACAINE IN DEXTROSE 0.75-8.25 % IT SOLN
INTRATHECAL | Status: DC | PRN
  Administered 2014-02-08: 1.9 mL via INTRATHECAL

## 2014-02-08 MED ORDER — LANOLIN HYDROUS EX OINT: 1.0000 "application " | TOPICAL_OINTMENT | CUTANEOUS | Status: DC | PRN

## 2014-02-08 MED ORDER — PHENYLEPHRINE 8 MG IN D5W 100 ML (0.08MG/ML) PREMIX OPTIME
INJECTION | INTRAVENOUS | Status: AC
  Filled 2014-02-08: qty 100

## 2014-02-08 MED ORDER — NALBUPHINE HCL 10 MG/ML IJ SOLN
INTRAMUSCULAR | Status: AC
  Filled 2014-02-08: qty 1

## 2014-02-08 MED ORDER — DIPHENHYDRAMINE HCL 50 MG/ML IJ SOLN: 12.5000 mg | INTRAMUSCULAR | Status: DC | PRN

## 2014-02-08 MED ORDER — NALBUPHINE HCL 10 MG/ML IJ SOLN
5.0000 mg | Freq: Once | INTRAMUSCULAR | Status: AC | PRN
  Administered 2014-02-08: 5 mg via SUBCUTANEOUS

## 2014-02-08 MED ORDER — MEPERIDINE HCL 25 MG/ML IJ SOLN: 6.2500 mg | INTRAMUSCULAR | Status: DC | PRN

## 2014-02-08 MED ORDER — ONDANSETRON HCL 4 MG PO TABS: 4.0000 mg | ORAL_TABLET | ORAL | Status: DC | PRN

## 2014-02-08 MED ORDER — SIMETHICONE 80 MG PO CHEW
80.0000 mg | CHEWABLE_TABLET | Freq: Three times a day (TID) | ORAL | Status: DC
  Administered 2014-02-09 – 2014-02-10 (×6): 80 mg via ORAL
  Filled 2014-02-08 (×6): qty 1

## 2014-02-08 MED ORDER — ZOLPIDEM TARTRATE 5 MG PO TABS: 5.0000 mg | ORAL_TABLET | Freq: Every evening | ORAL | Status: DC | PRN

## 2014-02-08 MED ORDER — ONDANSETRON HCL 4 MG/2ML IJ SOLN
INTRAMUSCULAR | Status: DC | PRN
  Administered 2014-02-08: 4 mg via INTRAVENOUS

## 2014-02-08 MED ORDER — SODIUM CHLORIDE 0.9 % IJ SOLN
3.0000 mL | INTRAMUSCULAR | Status: DC | PRN
  Administered 2014-02-09: 3 mL via INTRAVENOUS
  Filled 2014-02-08: qty 3

## 2014-02-08 MED ORDER — PHENYLEPHRINE 8 MG IN D5W 100 ML (0.08MG/ML) PREMIX OPTIME
INJECTION | INTRAVENOUS | Status: DC | PRN
  Administered 2014-02-08: 60 ug/min via INTRAVENOUS

## 2014-02-08 MED ORDER — OXYCODONE-ACETAMINOPHEN 5-325 MG PO TABS
2.0000 | ORAL_TABLET | ORAL | Status: DC | PRN
  Administered 2014-02-09 – 2014-02-11 (×8): 2 via ORAL
  Filled 2014-02-08 (×8): qty 2

## 2014-02-08 MED ORDER — OXYCODONE-ACETAMINOPHEN 5-325 MG PO TABS
1.0000 | ORAL_TABLET | ORAL | Status: DC | PRN
  Administered 2014-02-09: 1 via ORAL
  Filled 2014-02-08: qty 1

## 2014-02-08 MED ORDER — HYDROMORPHONE HCL 1 MG/ML IJ SOLN
0.5000 mg | Freq: Once | INTRAMUSCULAR | Status: AC
Start: 2014-02-08 — End: 2014-02-08
  Administered 2014-02-08: 0.5 mg via INTRAVENOUS
  Filled 2014-02-08: qty 1

## 2014-02-08 MED ORDER — ONDANSETRON HCL 4 MG/2ML IJ SOLN
INTRAMUSCULAR | Status: AC
  Filled 2014-02-08: qty 2

## 2014-02-08 MED ORDER — FENTANYL CITRATE 0.05 MG/ML IJ SOLN
INTRAMUSCULAR | Status: AC
  Administered 2014-02-08: 25 ug via INTRAVENOUS
  Filled 2014-02-08: qty 2

## 2014-02-08 MED ORDER — MENTHOL 3 MG MT LOZG: 1.0000 | LOZENGE | OROMUCOSAL | Status: DC | PRN

## 2014-02-08 MED ORDER — WITCH HAZEL-GLYCERIN EX PADS: 1.0000 "application " | MEDICATED_PAD | CUTANEOUS | Status: DC | PRN

## 2014-02-08 MED ORDER — NALBUPHINE HCL 10 MG/ML IJ SOLN: 5.0000 mg | Freq: Once | INTRAMUSCULAR | Status: AC | PRN

## 2014-02-08 MED ORDER — HYDROMORPHONE HCL 1 MG/ML IJ SOLN
0.5000 mg | INTRAMUSCULAR | Status: AC
  Administered 2014-02-08: 0.5 mg via INTRAVENOUS
  Filled 2014-02-08: qty 1

## 2014-02-08 MED ORDER — OXYTOCIN 40 UNITS IN LACTATED RINGERS INFUSION - SIMPLE MED
INTRAVENOUS | Status: DC | PRN
  Administered 2014-02-08: 40 [IU] via INTRAVENOUS

## 2014-02-08 MED ORDER — NALBUPHINE HCL 10 MG/ML IJ SOLN
5.0000 mg | INTRAMUSCULAR | Status: DC | PRN
Start: 2014-02-08 — End: 2014-02-11

## 2014-02-08 MED ORDER — DIPHENHYDRAMINE HCL 25 MG PO CAPS: 25.0000 mg | ORAL_CAPSULE | Freq: Four times a day (QID) | ORAL | Status: DC | PRN

## 2014-02-08 MED ORDER — NALOXONE HCL 1 MG/ML IJ SOLN
1.0000 ug/kg/h | INTRAVENOUS | Status: DC | PRN
  Filled 2014-02-08: qty 2

## 2014-02-08 MED ORDER — CEFAZOLIN SODIUM-DEXTROSE 2-3 GM-% IV SOLR
2.0000 g | INTRAVENOUS | Status: AC
  Administered 2014-02-08: 2 g via INTRAVENOUS
  Filled 2014-02-08: qty 50

## 2014-02-08 MED ORDER — FENTANYL CITRATE 0.05 MG/ML IJ SOLN
INTRAMUSCULAR | Status: AC
  Filled 2014-02-08: qty 2

## 2014-02-08 MED ORDER — SIMETHICONE 80 MG PO CHEW: 80.0000 mg | CHEWABLE_TABLET | ORAL | Status: DC | PRN

## 2014-02-08 MED ORDER — LACTATED RINGERS IV SOLN
INTRAVENOUS | Status: DC
  Administered 2014-02-08 – 2014-02-09 (×2): via INTRAVENOUS

## 2014-02-08 MED ORDER — OXYTOCIN 10 UNIT/ML IJ SOLN
INTRAMUSCULAR | Status: AC
  Filled 2014-02-08: qty 4

## 2014-02-08 MED ORDER — MORPHINE SULFATE (PF) 0.5 MG/ML IJ SOLN
INTRAMUSCULAR | Status: DC | PRN
  Administered 2014-02-08: .15 mg via INTRATHECAL

## 2014-02-08 MED ORDER — NALOXONE HCL 0.4 MG/ML IJ SOLN: 0.4000 mg | INTRAMUSCULAR | Status: DC | PRN

## 2014-02-08 MED ORDER — SIMETHICONE 80 MG PO CHEW
80.0000 mg | CHEWABLE_TABLET | ORAL | Status: DC
  Administered 2014-02-10 – 2014-02-11 (×2): 80 mg via ORAL
  Filled 2014-02-08 (×3): qty 1

## 2014-02-08 MED ORDER — OXYTOCIN 40 UNITS IN LACTATED RINGERS INFUSION - SIMPLE MED: 62.5000 mL/h | INTRAVENOUS | Status: AC

## 2014-02-08 MED ORDER — TETANUS-DIPHTH-ACELL PERTUSSIS 5-2.5-18.5 LF-MCG/0.5 IM SUSP: 0.5000 mL | Freq: Once | INTRAMUSCULAR | Status: DC

## 2014-02-08 MED ORDER — ONDANSETRON HCL 4 MG/2ML IJ SOLN: 4.0000 mg | Freq: Three times a day (TID) | INTRAMUSCULAR | Status: DC | PRN

## 2014-02-08 SURGICAL SUPPLY — 42 items
BENZOIN TINCTURE PRP APPL 2/3 (GAUZE/BANDAGES/DRESSINGS) ×3 IMPLANT
CANISTER WOUND CARE 500ML ATS (WOUND CARE) IMPLANT
CLAMP CORD UMBIL (MISCELLANEOUS) ×3 IMPLANT
CLOSURE WOUND 1/2 X4 (GAUZE/BANDAGES/DRESSINGS) ×1
CLOTH BEACON ORANGE TIMEOUT ST (SAFETY) ×3 IMPLANT
CONTAINER PREFILL 10% NBF 15ML (MISCELLANEOUS) IMPLANT
DRAPE SHEET LG 3/4 BI-LAMINATE (DRAPES) ×3 IMPLANT
DRESSING DISP NPWT PICO 4X12 (MISCELLANEOUS) IMPLANT
DRSG OPSITE POSTOP 4X10 (GAUZE/BANDAGES/DRESSINGS) ×3 IMPLANT
DRSG VAC ATS LRG SENSATRAC (GAUZE/BANDAGES/DRESSINGS) IMPLANT
DRSG VAC ATS SM SENSATRAC (GAUZE/BANDAGES/DRESSINGS) IMPLANT
DURAPREP 26ML APPLICATOR (WOUND CARE) ×3 IMPLANT
ELECT REM PT RETURN 9FT ADLT (ELECTROSURGICAL) ×3
ELECTRODE REM PT RTRN 9FT ADLT (ELECTROSURGICAL) ×1 IMPLANT
EXTRACTOR VACUUM M CUP 4 TUBE (SUCTIONS) IMPLANT
EXTRACTOR VACUUM M CUP 4' TUBE (SUCTIONS)
GLOVE BIO SURGEON STRL SZ 6.5 (GLOVE) ×2 IMPLANT
GLOVE BIO SURGEONS STRL SZ 6.5 (GLOVE) ×1
GOWN STRL REUS W/TWL LRG LVL3 (GOWN DISPOSABLE) ×6 IMPLANT
KIT ABG SYR 3ML LUER SLIP (SYRINGE) IMPLANT
NEEDLE HYPO 25X5/8 SAFETYGLIDE (NEEDLE) ×3 IMPLANT
NS IRRIG 1000ML POUR BTL (IV SOLUTION) ×3 IMPLANT
PACK C SECTION WH (CUSTOM PROCEDURE TRAY) ×3 IMPLANT
PAD OB MATERNITY 4.3X12.25 (PERSONAL CARE ITEMS) ×3 IMPLANT
RTRCTR C-SECT PINK 25CM LRG (MISCELLANEOUS) ×3 IMPLANT
SCRUB PCMX 4 OZ (MISCELLANEOUS) ×3 IMPLANT
STAPLER VISISTAT 35W (STAPLE) IMPLANT
STRIP CLOSURE SKIN 1/2X4 (GAUZE/BANDAGES/DRESSINGS) ×2 IMPLANT
SUT MNCRL 0 VIOLET CTX 36 (SUTURE) ×2 IMPLANT
SUT MNCRL AB 3-0 PS2 27 (SUTURE) ×3 IMPLANT
SUT MONOCRYL 0 CTX 36 (SUTURE) ×4
SUT PDS AB 0 CTX 36 PDP370T (SUTURE) IMPLANT
SUT PLAIN 0 NONE (SUTURE) IMPLANT
SUT VIC AB 0 CTXB 36 (SUTURE) IMPLANT
SUT VIC AB 2-0 CT1 (SUTURE) ×3 IMPLANT
SUT VIC AB 2-0 CT1 27 (SUTURE) ×2
SUT VIC AB 2-0 CT1 TAPERPNT 27 (SUTURE) ×1 IMPLANT
SUT VIC AB 2-0 SH 27 (SUTURE)
SUT VIC AB 2-0 SH 27XBRD (SUTURE) IMPLANT
TOWEL OR 17X24 6PK STRL BLUE (TOWEL DISPOSABLE) ×3 IMPLANT
TRAY FOLEY CATH 14FR (SET/KITS/TRAYS/PACK) ×3 IMPLANT
WATER STERILE IRR 1000ML POUR (IV SOLUTION) ×3 IMPLANT

## 2014-02-08 NOTE — OR Nursing (Signed)
Pt spent 30 mins with baby in NICU

## 2014-02-08 NOTE — Transfer of Care (Signed)
Immediate Anesthesia Transfer of Care Note  Patient: [name]  Procedure(s) Performed: Procedure(s): REPEAT  CESAREAN SECTION (N/A)  Patient Location: PACU  Anesthesia Type:Spinal  Level of Consciousness: awake, alert  and oriented  Airway & Oxygen Therapy: Patient Spontanous Breathing  Post-op Assessment: Report given to PACU RN and Post -op Vital signs reviewed and stable  Post vital signs: Reviewed and stable  Complications: No apparent anesthesia complications

## 2014-02-08 NOTE — Anesthesia Procedure Notes (Signed)
Spinal Patient location during procedure: OR Start time: 02/08/2014 1:06 PM Staffing Anesthesiologist: Virlan Kempker A. Performed by: anesthesiologist  Preanesthetic Checklist Completed: patient identified, site marked, surgical consent, pre-op evaluation, timeout performed, IV checked, risks and benefits discussed and monitors and equipment checked Spinal Block Patient position: sitting Prep: site prepped and draped and DuraPrep Patient monitoring: heart rate, cardiac monitor, continuous pulse ox and blood pressure Approach: midline Location: L3-4 Injection technique: single-shot Needle Needle type: Sprotte  Needle gauge: 24 G Needle length: 9 cm Needle insertion depth: 5 cm Assessment Sensory level: T4 Additional Notes Patient tolerated procedure well. Adequate sensory level.

## 2014-02-08 NOTE — Op Note (Signed)
Cesarean Section Procedure Note   [name]   01/12/2014 - 02/08/2014  Indications: Intrauterine pregnancy at 8647w6d; vasa previa, history previous cesarean deliveries x 2  Pre-operative Diagnosis: Intrauterine pregnancy at 6647w6d; vasa previa, history previous cesarean deliveries x 2    Post-operative Diagnosis: Same   Surgeon: Antionette CharJACKSON-MOORE,Fatmata Legere A  Assistants: Coral CeoHARPER, CHARLES A  Anesthesia: spinal  Procedure Details:  The patient was seen in the Holding Room. The risks, benefits, complications, treatment options, and expected outcomes were discussed with the patient. The patient concurred with the proposed plan, giving informed consent. The patient was identified as [name] and the procedure verified as C-Section Delivery. A Time Out was held and the above information confirmed.  After induction of anesthesia, the patient was draped and prepped in the usual sterile manner. A transverse incision was made and carried down through the subcutaneous tissue to the fascia. The prior skin incision/scar was excised with a scalpel.  The fascial incision was made and extended transversely. The fascia was separated from the underlying rectus tissue superiorly and inferiorly. The peritoneum was identified and entered. The peritoneal incision was extended longitudinally. The utero-vesical peritoneal reflection was incised transversely and the bladder flap was sharply freed from the lower uterine segment. A low transverse uterine incision was made. Delivered from cephalic presentation was a living newborn infant APGAR (1 MIN): 8   APGAR (5 MINS): 9    A cord ph was sent. The umbilical cord was clamped and cut cord. A sample was obtained for evaluation. The placenta was removed manually.  The placenta was mildly adherent however a cleavage plane was developed.  The uterine incision was closed with running locked sutures of 1-0 Monocryl. A second imbricating layer of the same suture was placed.   Hemostasis was observed. The paracolic gutters were irrigated. The parieto peritoneum was closed in a running fashion with 2-0 Vicryl.  The fascia was then reapproximated with running sutures of 0 PDS.  The skin was closed with suture.  Instrument, sponge, and needle counts were correct prior the abdominal closure and were correct at the conclusion of the case.    Findings:  See above   Estimated Blood Loss: 600 ml  Total IV Fluids: per Anesthesiology  Urine Output: per Anesthesiology  Specimens: Placenta to Pathology  Complications: no complications  Disposition: PACU - hemodynamically stable.  Maternal Condition: stable   Baby condition / location:  Nursery    Signed: Surgeon(s): Antionette CharLisa Jackson-Moore, MD Brock Badharles A Harper, MD

## 2014-02-08 NOTE — Progress Notes (Signed)
Breast pump initiated.  Education provided to patient and significant other at bedside.  Written materials and supplies provided.  Osvaldo AngstK. Shalon Salado, RN-------------------

## 2014-02-08 NOTE — Plan of Care (Signed)
Problem: Phase I Progression Outcomes Goal: Foley catheter patent Outcome: Completed/Met Date Met:  02/08/14 Goal: IS, TCDB as ordered Outcome: Completed/Met Date Met:  02/08/14 Goal: Initial discharge plan identified Outcome: Completed/Met Date Met:  02/08/14

## 2014-02-08 NOTE — Anesthesia Preprocedure Evaluation (Addendum)
Anesthesia Evaluation  Patient identified by MRN, date of birth, ID band Patient awake    Reviewed: Allergy & Precautions, H&P , NPO status , Patient's Chart, lab work & pertinent test results, reviewed documented beta blocker date and time   Airway Mallampati: III  TM Distance: >3 FB Neck ROM: Full    Dental no notable dental hx. (+) Teeth Intact   Pulmonary asthma ,  breath sounds clear to auscultation  Pulmonary exam normal       Cardiovascular + Valvular Problems/Murmurs Rhythm:Regular Rate:Normal     Neuro/Psych negative neurological ROS  negative psych ROS   GI/Hepatic Neg liver ROS, GERD-  Medicated and Controlled,  Endo/Other  negative endocrine ROS  Renal/GU negative Renal ROS  negative genitourinary   Musculoskeletal negative musculoskeletal ROS (+)   Abdominal   Peds  Hematology  (+) Sickle cell trait and anemia , Thrombocytopenia- probable Gestational   Anesthesia Other Findings   Reproductive/Obstetrics (+) Pregnancy Vasa previa 34 6/7 Previous C/Section                           Anesthesia Physical Anesthesia Plan  ASA: II  Anesthesia Plan: Spinal   Post-op Pain Management:    Induction:   Airway Management Planned: Natural Airway  Additional Equipment:   Intra-op Plan:   Post-operative Plan:   Informed Consent: I have reviewed the patients History and Physical, chart, labs and discussed the procedure including the risks, benefits and alternatives for the proposed anesthesia with the patient or authorized representative who has indicated his/her understanding and acceptance.     Plan Discussed with: Anesthesiologist, CRNA and Surgeon  Anesthesia Plan Comments:         Anesthesia Quick Evaluation

## 2014-02-08 NOTE — Anesthesia Postprocedure Evaluation (Signed)
  Anesthesia Post-op Note  Patient: [name]  Procedure(s) Performed: Procedure(s): REPEAT  CESAREAN SECTION (N/A)  Patient Location: PACU  Anesthesia Type:Spinal  Level of Consciousness: awake, alert  and oriented  Airway and Oxygen Therapy: Patient Spontanous Breathing  Post-op Pain: none  Post-op Assessment: Post-op Vital signs reviewed, Patient's Cardiovascular Status Stable, Respiratory Function Stable, Patent Airway, No signs of Nausea or vomiting, Pain level controlled, No headache, No backache, No residual numbness and No residual motor weakness  Post-op Vital Signs: Reviewed and stable  Last Vitals:  Filed Vitals:   02/08/14 1530  BP: 126/58  Pulse: 95  Temp: 36.6 C  Resp: 20    Complications: No apparent anesthesia complications

## 2014-02-08 NOTE — Progress Notes (Signed)
Patient ID: [name], female   DOB: 09/28/1980, 33 y.o.   MRN: [id number] Hospital Day: 2028  S: No complaints.  O: Blood pressure 107/62, pulse 93, temperature 98.9 F (37.2 C), temperature source Oral, resp. rate 18, height 5' 8.5" (1.74 m), weight 166 lb (75.297 kg), last menstrual period 06/03/2013, SpO2 99 %.   WUJ:WJXBJYNWFHT:Baseline: 150 bpm Toco: None SVE:   A/P- 33 y.o. admitted with:  Vasa Previa.  Stable.  Repeat C/S today.  Present on Admission:  . Vasa previa . Anemia during pregnancy  Pregnancy Complications: Vasa Previa  Preterm labor management: no treatment necessary Dating:  4141w6d PNL Needed:  none FWB:  good PTL:  none ROD: C-section without labor.  OCOR for repeat C/S.

## 2014-02-09 ENCOUNTER — Encounter (HOSPITAL_COMMUNITY): Payer: Self-pay | Admitting: Obstetrics & Gynecology

## 2014-02-09 LAB — CBC
HCT: 29.9 % — ABNORMAL LOW (ref 36.0–46.0)
HEMOGLOBIN: 10.1 g/dL — AB (ref 12.0–15.0)
MCH: 28.9 pg (ref 26.0–34.0)
MCHC: 33.8 g/dL (ref 30.0–36.0)
MCV: 85.7 fL (ref 78.0–100.0)
PLATELETS: 104 10*3/uL — AB (ref 150–400)
RBC: 3.49 MIL/uL — AB (ref 3.87–5.11)
RDW: 20.8 % — ABNORMAL HIGH (ref 11.5–15.5)
WBC: 11.5 10*3/uL — AB (ref 4.0–10.5)

## 2014-02-09 LAB — RPR

## 2014-02-09 NOTE — Plan of Care (Signed)
Problem: Phase I Progression Outcomes Goal: Pain controlled with appropriate interventions Outcome: Completed/Met Date Met:  02/09/14     

## 2014-02-09 NOTE — Progress Notes (Signed)
Subjective: Postpartum Day 1: Cesarean Delivery Patient reports tolerating PO and no problems voiding.    Objective: Vital signs in last 24 hours: Temp:  [97.3 F (36.3 C)-98.9 F (37.2 C)] 97.8 F (36.6 C) (12/03 0500) Pulse Rate:  [63-113] 63 (12/03 0500) Resp:  [15-20] 18 (12/03 0500) BP: (94-129)/(42-85) 105/55 mmHg (12/03 0500) SpO2:  [97 %-100 %] 99 % (12/03 0500)  Physical Exam:  General: alert and no distress Lochia: appropriate Uterine Fundus: firm Incision: healing well DVT Evaluation: No evidence of DVT seen on physical exam.   Recent Labs  02/08/14 0759 02/09/14 0536  HGB 10.6* 10.1*  HCT 31.4* 29.9*    Assessment/Plan: Status post Cesarean section. Doing well postoperatively.  Continue current care.  Nachman Sundt A 02/09/2014, 9:11 AM

## 2014-02-09 NOTE — Plan of Care (Signed)
Problem: Consults Goal: Postpartum Patient Education (See Patient Education module for education specifics.)  Outcome: Completed/Met Date Met:  02/09/14  Problem: Phase I Progression Outcomes Goal: VS, stable, temp < 100.4 degrees F Outcome: Completed/Met Date Met:  02/09/14  Problem: Phase II Progression Outcomes Goal: Progress activity as tolerated unless otherwise ordered Outcome: Completed/Met Date Met:  02/09/14 Goal: Afebrile, VS remain stable Outcome: Completed/Met Date Met:  02/09/14 Goal: Incision intact & without signs/symptoms of infection Outcome: Completed/Met Date Met:  02/09/14 Goal: Rh isoimmunization per orders Outcome: Not Applicable Date Met:  70/17/79

## 2014-02-09 NOTE — Addendum Note (Signed)
Addendum  created 02/09/14 0754 by Yolonda KidaAlison L Madalena Kesecker, CRNA   Modules edited: Notes Section   Notes Section:  File: [id number]

## 2014-02-09 NOTE — Lactation Note (Signed)
This note was copied from the chart of Sherry [name]. Lactation Consultation Note  I spoke with mom in NICU.  She has pumped and bottle fed previous baby due to unresolved nipple trauma.  Mom states her goal for this baby is to provide breast milk for her baby in NICU and put the baby to breast when possible.  Reassured mom that we will assist with latch when baby is ready.  Mom states she has no questions about pump use.  LC will demonstrate hand expression when she is back in her room.  Mom asking about a pump after discharge.  She does not have WIC but desires to get certified.  I will fax over a pump referral form.  Patient Name: [name] EAVWU'JToday's Date: 02/09/2014 Reason for consult: Initial assessment;NICU baby;Late preterm infant   Maternal Data    Feeding Feeding Type: Formula Length of feed: 30 min  LATCH Score/Interventions                      Lactation Tools Discussed/Used WIC Program: No Pump Review: Setup, frequency, and cleaning;Milk Storage Initiated by:: RN Date initiated:: 02/08/14   Consult Status Consult Status: Follow-up    Huston FoleyMOULDEN, [name] 02/09/2014, 10:48 AM

## 2014-02-09 NOTE — Anesthesia Postprocedure Evaluation (Signed)
  Anesthesia Post-op Note  Patient: [name]  Procedure(s) Performed: Procedure(s): REPEAT  CESAREAN SECTION (N/A)  Patient Location: Women's Unit  Anesthesia Type:Spinal  Level of Consciousness: awake, alert , oriented and patient cooperative  Airway and Oxygen Therapy: Patient Spontanous Breathing  Post-op Pain: mild  Post-op Assessment: Post-op Vital signs reviewed, Patient's Cardiovascular Status Stable, Respiratory Function Stable, Patent Airway, No headache, No backache, No residual numbness and No residual motor weakness  Post-op Vital Signs: Reviewed and stable  Last Vitals:  Filed Vitals:   02/09/14 0500  BP: 105/55  Pulse: 63  Temp: 36.6 C  Resp: 18    Complications: No apparent anesthesia complications

## 2014-02-10 LAB — MRSA CULTURE

## 2014-02-10 NOTE — Plan of Care (Signed)
Problem: Discharge Progression Outcomes Goal: Remove staples per MD order Outcome: Not Applicable Date Met:  80/63/86

## 2014-02-10 NOTE — Progress Notes (Signed)
Patient ID: [name], female   DOB: 12/20/1980, [age] y.o.   MRN: [id number] Subjective: POD# 2 s/p Cesarean Delivery.  Indications: vasa previa  Feeding: pumping Patient reports tolerating PO.  Denies HA/SOB/C/P/N/V/dizziness.  Reports flatus. Breast symptoms: No..  She reports vaginal bleeding as normal, without clots.  She is ambulating, urinating without difficulty.     Objective: Vital signs in last 24 hours: BP 95/62 mmHg  Pulse 88  Temp(Src) 97.9 F (36.6 C) (Oral)  Resp 18  Ht 5' 8.5" (1.74 m)  Wt 76.114 kg (167 lb 12.8 oz)  BMI 25.14 kg/m2  SpO2 100%  LMP 06/03/2013  Breastfeeding? Unknown       Physical Exam:  General: alert CV: Regular rate and rhythm Resp: clear Abdomen: soft, nontender, normal bowel sounds Lochia: minimal Uterine Fundus: firm, below umbilicus, nontender Incision: clean, dry and intact Ext: extremities normal, atraumatic, no cyanosis or edema    Recent Labs  02/08/14 0759 02/09/14 0536  HGB 10.6* 10.1*  HCT 31.4* 29.9*      Assessment/Plan: 33 y.o.  status post Cesarean section. POD# 2.   Doing well, stable.               Ambulate IS Routine post-op care  JACKSON-MOORE,Garritt Molyneux A 02/10/2014, 12:07 PM

## 2014-02-10 NOTE — Lactation Note (Signed)
This note was copied from the chart of [name]. Lactation Consultation  Patient Name: [name]       Follow up consult with this mom of a NICU baby, now 6047 hours old and 35 1/7 weeks CGA. Mom was able to hand express and pump about 2 mls of colostrum/transitional milk today. I did basic pumping teaching with mom, and reviewed the NICU booklet on providing EBm for a NICU baby. [facility], which she will need on discharge tomorrow. Mom ver receptive to teaching. Skin to skin and latching of her baby encouraged. Mom aware lactation will help her and baby with breast feeding, both in hospital and o/p. Mom knows to call for questions/concerns. Today's Date: 02/10/2014 Reason for consult: Follow-up assessment;Late preterm infant;NICU baby;Infant < 6lbs   Maternal Data Formula Feeding for Exclusion: Yes (baby in NICU) Has patient been taught Hand Expression?: Yes Does the patient have breastfeeding experience prior to this delivery?: Yes  Feeding Feeding Type: Formula Length of feed: 30 min  LATCH Score/Interventions                      Lactation Tools Discussed/Used WIC Program: Yes (mom will need a loaner pump on d/c 12/5 has appointmentn wioth WIC for 12/7) Initiated by:: bedside rn Date initiated:: 02/08/14   Consult Status Consult Status: Follow-up Date: 02/11/14 Follow-up type: In-patient    Alfred LevinsLee, Arielys Wander[place] Anne 02/10/2014, 1:47 PM

## 2014-02-10 NOTE — Plan of Care (Signed)
Problem: Phase I Progression Outcomes Goal: Voiding adequately Outcome: Completed/Met Date Met:  02/10/14 Goal: OOB as tolerated unless otherwise ordered Outcome: Completed/Met Date Met:  02/10/14  Problem: Phase II Progression Outcomes Goal: Pain controlled on oral analgesia Outcome: Completed/Met Date Met:  02/10/14 Goal: Tolerating diet Outcome: Completed/Met Date Met:  02/10/14  Problem: Discharge Progression Outcomes Goal: Tolerating diet Outcome: Completed/Met Date Met:  02/10/14 Goal: Pain controlled with appropriate interventions Outcome: Completed/Met Date Met:  02/10/14

## 2014-02-11 LAB — TYPE AND SCREEN
ABO/RH(D): O POS
ANTIBODY SCREEN: NEGATIVE
UNIT DIVISION: 0
Unit division: 0

## 2014-02-11 MED ORDER — NORETHINDRONE 0.35 MG PO TABS: 1.0000 | ORAL_TABLET | Freq: Every day | ORAL | Status: DC

## 2014-02-11 MED ORDER — OXYCODONE-ACETAMINOPHEN 5-325 MG PO TABS: 2.0000 | ORAL_TABLET | ORAL | Status: DC | PRN

## 2014-02-11 NOTE — Lactation Note (Signed)
This note was copied from the chart of Sherry Colonel BaldSandra Art. Lactation Consultation Note  [facility].  Mom is not pumping consistently.  Instructed to pump every 3 hours to establish milk supply.  No questions or concerns at present.  Patient Name: [name] [id number]'s Date: 02/11/2014     Maternal Data    Feeding Feeding Type: Formula Length of feed: 30 min  LATCH Score/Interventions                      Lactation Tools Discussed/Used     Consult Status      Huston FoleyMOULDEN, Verenice Westrich S 02/11/2014, 12:30 PM

## 2014-02-11 NOTE — Discharge Summary (Signed)
  Obstetric Discharge Summary Reason for Admission: observation/evaluation Prenatal Procedures: ultrasound Intrapartum Procedures: cesarean: low cervical, transverse Postpartum Procedures: none Complications-Operative and Postpartum: none  HEMOGLOBIN  Date Value Ref Range Status  02/09/2014 10.1* 12.0 - 15.0 g/dL Final   HCT  Date Value Ref Range Status  02/09/2014 29.9* 36.0 - 46.0 % Final    Physical Exam:  General: alert Lochia: appropriate Uterine: firm Incision: clean, dry and intact DVT Evaluation: No evidence of DVT seen on physical exam.  Discharge Diagnoses: Active Problems:   Vasa previa   Abdominal pain affecting pregnancy, antepartum   Non-reactive NST (non-stress test)   [redacted] weeks gestation of pregnancy   Anemia during pregnancy   [redacted] weeks gestation of pregnancy   Poor fetal growth affecting management of mother in third trimester, antepartum   [redacted] weeks gestation of pregnancy   Irregular contractions   S/P cesarean section   Discharge Information: Date: 02/11/2014 Activity: pelvic rest Diet: routine Medications:  Prior to Admission medications   Medication Sig Start Date End Date Taking? Authorizing Provider  albuterol (PROVENTIL HFA;VENTOLIN HFA) 108 (90 BASE) MCG/ACT inhaler Inhale 2 puffs into the lungs every 6 (six) hours as needed. Asthma    Yes Historical Provider, MD  cyclobenzaprine (FLEXERIL) 10 MG tablet Take 1 tablet (10 mg total) by mouth 2 (two) times daily as needed for muscle spasms. 08/30/13  Yes Marny LowensteinJulie N Wenzel, PA-C  Doxylamine-Pyridoxine 10-10 MG TBEC Take 3 tablets by mouth as directed. Take 1 tablet in am and 2 tablets at bedtime 09/26/13  Yes Brock Badharles [name] Harper, MD  traMADol (ULTRAM) 50 MG tablet Take 1 tablet (50 mg total) by mouth every 6 (six) hours as needed for moderate pain. 12/01/13  Yes Brock Badharles [name] Harper, MD  norethindrone (ORTHO MICRONOR) 0.35 MG tablet Take 1 tablet (0.35 mg total) by mouth daily. 02/11/14   Antionette CharLisa Jackson-Moore, MD   oxyCODONE-acetaminophen (PERCOCET/ROXICET) 5-325 MG per tablet Take 2 tablets by mouth every 4 (four) hours as needed (for pain scale equal to or greater than 7). 02/11/14   Antionette CharLisa Jackson-Moore, MD    Condition: stable Instructions: refer to routine discharge instructions Discharge to: home Follow-up Information    Follow up with HARPER,CHARLES A, MD In 2 weeks.   Specialty:  Obstetrics and Gynecology   Contact information:   [address] [place] [address] [phone]       Newborn Data:  Live born female  Birth Weight: 5 lb 12.8 oz (2630 g) APGAR: 8, 9   Home with mother.  [name],[name] 02/11/2014, 10:33 AM

## 2014-02-11 NOTE — Discharge Instructions (Signed)
Cesarean Section (Postpartum Care) °These discharge instructions provide you with general information on cesarean section (caesarean, cesarian, caesarian) and caring for yourself after you leave the hospital. Your caregiver may also give you specific instructions. °Please read these instructions and refer to them in the next few weeks. If you have any questions regarding these instructions, you may call the nursing unit. If you have any problems after discharge, please call your doctor. If you are unable to reach your doctor, you should seek help at the nearest Emergency Department. °ACTIVITY °· Rest as much as possible the first two weeks at home.  °· When possible, have someone help you with your household activities and the new baby for 2 to 3 weeks.  °· Limit your housework and social activity. Increase your activity gradually as your strength returns.  °· Do not climb stairs more than two or three times a day.  °· Do not lift anything heavier than your baby.  °· Follow your doctor's instructions about driving a car.  °· Limit wearing support panties or control-top hose, since relying on your own muscles helps strengthen them.  °· Ask your doctor about exercises.  °NUTRITION °· You may return to your usual diet.  °· Drink 6 to 8 glasses of fluid a day.  °· Eat a well-balanced diet. Include portions of food from the meat/protein, milk, fruit, vegetable and bread groups.  °· Keep taking your prenatal or multivitamins.  °ELIMINATION °You should return to your usual bowel function. If constipation is a problem, you may take a mild laxative such as Milk of MagnesiaÔ with your caregiver’s permission. Gradually add fruit, vegetables and bran to your diet. Make sure to increase your fluids. °HYGIENE °You may shower, wash your hair and take tub baths unless your doctor tells you otherwise. Continue peri-care until your vaginal bleeding and discharge stops. Do not douche or use tampons until your caregiver says it is  OK. °FEVER °If you feel feverish or have shaking chills, take your temperature. If your temperature is 101° F (38.3° C), or is 100.4° F (38° C) two times in a four hour period, call your doctor. The fever may indicate infection. If you call early, infection can be treated with medicines that kill germs (antibiotics). Hospitalization may be avoided. °PAIN CONTROL °You may still have mild discomfort. Only take over-the-counter or prescription medicine as directed by your caregiver. Do not take aspirin; it can cause bleeding. If the pain is not relieved by your medicine or becomes worse, call your caregiver. °INCISION CARE °Clean your cut (incision) gently with soap and water. If your caregiver says it is okay, leave the incision without a dressing unless it is draining or irritated. If you have small adhesive strips across the incision and they do not fall off within 7 days, carefully peel them off. Check the incision daily for increased redness, drainage, swelling or separation of skin. Call your caregiver if any of these happen. °VAGINAL CARE °You may have a vaginal discharge or bleeding for up to 6 weeks. If the vaginal discharge becomes bright red, bad smelling, heavy in amount, has blood clots or if you have burning or frequency when urinating, call your caregiver. °SEXUAL INTERCOURSE °It is best to follow your caregiver's advice about when you may safely resume sexual intercourse. Most women can begin to have intercourse two to three weeks after their baby's birth. °You can become pregnant before you have a period. If you decide to have sexual intercourse, you should use   birth control if you do not want to become pregnant right away. ° °BREAST CARE °If you are not breastfeeding and your breasts become tender, hard or leak milk, you may wear a firm fitting bra and apply ice to the breasts. If you are breastfeeding, wear a good support bra. Call your caregiver if you have breast pain, flu-like symptoms, fever or  hardness and reddening of your breasts. °POSTPARTUM BLUES °After the excitement of having the baby goes away, you may commonly have a period of low spirits or “blues." Discuss your feelings with your partner, family and friends. This may be caused by the changing hormone levels in your body. You may want to contact your caregiver if this is worrisome. °SEEK MEDICAL CARE IF: °· There is swelling, redness or increasing pain in the wound area.  °· Pus is coming from the wound.  °· You notice a bad smell from the wound or surgical dressing.  °· You have pain, redness and swelling from the intravenous site.  °· The wound is breaking open (the edges are not staying together).  °· You feel dizzy or feel like fainting.  °· You develop pain or bleeding when you urinate.  °· You develop diarrhea.  °· You develop nausea and vomiting.  °· You develop abnormal vaginal discharge.  °· You develop a rash.  °· You have any type of abnormal reaction or develop an allergy to your medication.  °· You need stronger pain medication for your pain.  °SEEK IMMEDIATE MEDICAL CARE: °· You develop a temperature of 101 or higher.  °· You develop abdominal pain.  °· You develop chest pain.  °· You develop shortness of breath.  °· You pass out.  °· You develop pain, swelling or redness of your leg.  °· You develop heavy vaginal bleeding with or without blood clots.  °Document Released: 11/16/2001 Document Re-Released: 08/14/2009 °ExitCare® Patient Information ©2011 [employer]. °

## 2014-02-11 NOTE — Progress Notes (Signed)
Honey dressing removed.  Applied Mepitel dressing and reapplied honey dressing.  Instruction on care for Mepitel dressing @ home. Discharge instructions reviewed with patient.  Patient states understanding of home care, medications, activity, signs/symptoms to report to MD and return MD office visit.  Patients significant other and family will assist with her care @ home.  No home equipment needed.  Patient ambulated for discharge in stable condition with staff without incident.

## 2014-02-22 ENCOUNTER — Ambulatory Visit: Payer: Medicaid Other | Admitting: Obstetrics & Gynecology

## 2014-03-06 ENCOUNTER — Encounter: Payer: Self-pay | Admitting: *Deleted

## 2014-03-07 ENCOUNTER — Encounter: Payer: Self-pay | Admitting: Obstetrics & Gynecology

## 2014-07-24 ENCOUNTER — Encounter (HOSPITAL_COMMUNITY): Payer: Self-pay | Admitting: *Deleted

## 2014-07-24 ENCOUNTER — Emergency Department (HOSPITAL_COMMUNITY): Payer: Medicaid Other

## 2014-07-24 ENCOUNTER — Emergency Department (HOSPITAL_COMMUNITY)
Admission: EM | Admit: 2014-07-24 | Discharge: 2014-07-24 | Disposition: A | Discharge status: Home / Self Care | Payer: Medicaid Other | Attending: Emergency Medicine | Admitting: Emergency Medicine

## 2014-07-24 DIAGNOSIS — Z862 Personal history of diseases of the blood and blood-forming organs and certain disorders involving the immune mechanism: Secondary | ICD-10-CM | POA: Insufficient documentation

## 2014-07-24 DIAGNOSIS — R0789 Other chest pain: Secondary | ICD-10-CM | POA: Diagnosis not present

## 2014-07-24 DIAGNOSIS — R011 Cardiac murmur, unspecified: Secondary | ICD-10-CM | POA: Diagnosis not present

## 2014-07-24 DIAGNOSIS — Z79899 Other long term (current) drug therapy: Secondary | ICD-10-CM | POA: Diagnosis not present

## 2014-07-24 DIAGNOSIS — Z8719 Personal history of other diseases of the digestive system: Secondary | ICD-10-CM | POA: Insufficient documentation

## 2014-07-24 DIAGNOSIS — R079 Chest pain, unspecified: Secondary | ICD-10-CM | POA: Diagnosis present

## 2014-07-24 DIAGNOSIS — J45901 Unspecified asthma with (acute) exacerbation: Secondary | ICD-10-CM | POA: Diagnosis not present

## 2014-07-24 DIAGNOSIS — R200 Anesthesia of skin: Secondary | ICD-10-CM | POA: Insufficient documentation

## 2014-07-24 LAB — CBC WITH DIFFERENTIAL/PLATELET
Basophils Absolute: 0 10*3/uL (ref 0.0–0.1)
Basophils Relative: 1 % (ref 0–1)
EOS PCT: 4 % (ref 0–5)
Eosinophils Absolute: 0.3 10*3/uL (ref 0.0–0.7)
HCT: 36.1 % (ref 36.0–46.0)
HEMOGLOBIN: 12.3 g/dL (ref 12.0–15.0)
LYMPHS ABS: 2.6 10*3/uL (ref 0.7–4.0)
LYMPHS PCT: 43 % (ref 12–46)
MCH: 29.6 pg (ref 26.0–34.0)
MCHC: 34.1 g/dL (ref 30.0–36.0)
MCV: 87 fL (ref 78.0–100.0)
Monocytes Absolute: 0.4 10*3/uL (ref 0.1–1.0)
Monocytes Relative: 7 % (ref 3–12)
Neutro Abs: 2.7 10*3/uL (ref 1.7–7.7)
Neutrophils Relative %: 45 % (ref 43–77)
PLATELETS: 164 10*3/uL (ref 150–400)
RBC: 4.15 MIL/uL (ref 3.87–5.11)
RDW: 13.2 % (ref 11.5–15.5)
WBC: 6 10*3/uL (ref 4.0–10.5)

## 2014-07-24 LAB — I-STAT TROPONIN, ED: TROPONIN I, POC: 0 ng/mL (ref 0.00–0.08)

## 2014-07-24 LAB — BASIC METABOLIC PANEL
ANION GAP: 12 (ref 5–15)
BUN: 6 mg/dL (ref 6–20)
CALCIUM: 9.1 mg/dL (ref 8.9–10.3)
CO2: 24 mmol/L (ref 22–32)
Chloride: 102 mmol/L (ref 101–111)
Creatinine, Ser: 0.84 mg/dL (ref 0.44–1.00)
GFR calc Af Amer: >60 mL/min (ref >60)
Glucose, Bld: 101 mg/dL — ABNORMAL HIGH (ref 65–99)
Potassium: 3.3 mmol/L — ABNORMAL LOW (ref 3.5–5.1)
SODIUM: 138 mmol/L (ref 135–145)

## 2014-07-24 MED ORDER — NAPROXEN 500 MG PO TABS
500.0000 mg | ORAL_TABLET | Freq: Two times a day (BID) | ORAL | Status: DC | PRN
Start: 2014-07-24 — End: 2014-12-20

## 2014-07-24 NOTE — ED Provider Notes (Signed)
CSN: [id number]     Arrival date & time 07/24/14  0148 History  This chart was scribed for Sherry Severinlga Alexzia Kasler, MD by Elveria Risingimelie Horne, ED scribe.  This patient was seen in room B16C/B16C and the patient's care was started at 3:21 AM.   Chief Complaint  Patient presents with  . Chest Pain  . Numbness    The history is provided by the patient. No language interpreter was used.   HPI Comments: [name] is a 34 y.o. female with PMHx of asthma, heart murmur, and anemia who presents to the Emergency Department complaining of left chest pain onset approximately two hours ago. Patient describes pressure likening sensation to having someone sit on her chest. Patient states she had some difficultly breathing stating it was had to stake deep breaths. Patient denies diaphoresis. Patient reports that the episode tonight endured for about 30 minutes before letting up. Patient with history of asthma reports having to use her asthma two days ago.   Patient reports history of similar pain with episodes occurring every few months. Patient reports three total occurrences. Patient reports that she's been evaluated [facility] where she was informed she had a "slight heart attack." She was later told by another provider that her heart is fine.   Past Medical History  Diagnosis Date  . Asthma   . GERD (gastroesophageal reflux disease)   . Allergy   . Heart murmur   . Headache(784.0)   . Anemia    Past Surgical History  Procedure Laterality Date  . Cesarean section    . Cesarean section N/A 02/08/2014    Procedure: REPEAT  CESAREAN SECTION;  Surgeon: Antionette CharLisa Jackson-Moore, MD;  Location: [facility];  Service: Obstetrics;  Laterality: N/A;   Family History  Problem Relation Age of Onset  . Diabetes Other   . Cancer Other   . Cancer Mother   . Heart defect Daughter   . Heart disease Maternal Grandmother   . Arthritis Maternal Grandmother   . Cancer Maternal Grandmother   . Diabetes Maternal Grandmother    . Hyperlipidemia Maternal Grandmother   . Depression Sister   . COPD Maternal Grandfather   . Arthritis Paternal Grandmother    History  Substance Use Topics  . Smoking status: Never Smoker   . Smokeless tobacco: Never Used  . Alcohol Use: No   OB History    Gravida Para Term Preterm AB TAB SAB Ectopic Multiple Living   4 3 2 1 1 1    0 3     Review of Systems  Constitutional: Negative for fever, chills and diaphoresis.  HENT: Negative for congestion, rhinorrhea and sore throat.   Eyes: Negative for visual disturbance.  Respiratory: Positive for shortness of breath. Negative for cough and wheezing.   Cardiovascular: Positive for chest pain.  Gastrointestinal: Negative for nausea, vomiting, abdominal pain and diarrhea.  Endocrine: Negative for polyuria.  Genitourinary: Negative for dysuria and hematuria.  Musculoskeletal: Negative for back pain and joint swelling.  Skin: Negative for rash.  Allergic/Immunologic: Negative for immunocompromised state.  Neurological: Negative for headaches.  Hematological: Does not bruise/bleed easily.  Psychiatric/Behavioral: Negative for confusion.    Allergies  Shrimp and Iodinated diagnostic agents  Home Medications   Prior to Admission medications   Medication Sig Start Date End Date Taking? Authorizing Provider  albuterol (PROVENTIL HFA;VENTOLIN HFA) 108 (90 BASE) MCG/ACT inhaler Inhale 2 puffs into the lungs every 6 (six) hours as needed. Asthma     Historical Provider,  MD  norethindrone (ORTHO MICRONOR) 0.35 MG tablet Take 1 tablet (0.35 mg total) by mouth daily. 02/11/14   Antionette CharLisa Jackson-Moore, MD  oxyCODONE-acetaminophen (PERCOCET/ROXICET) 5-325 MG per tablet Take 2 tablets by mouth every 4 (four) hours as needed (for pain scale equal to or greater than 7). 02/11/14   Antionette CharLisa Jackson-Moore, MD   Triage Vitals: BP 111/76 mmHg  Pulse 75  Temp(Src) 98.7 F (37.1 C) (Oral)  Resp 17  Ht 5' 8.5" (1.74 m)  Wt 130 lb (58.968 kg)  BMI 19.48  kg/m2  SpO2 99%  LMP 07/03/2014 (Approximate) Physical Exam  Constitutional: She is oriented to person, place, and time. She appears well-developed and well-nourished.  HENT:  Head: Normocephalic and atraumatic.  Nose: Nose normal.  Mouth/Throat: Oropharynx is clear and moist.  Eyes: Conjunctivae and EOM are normal. Pupils are equal, round, and reactive to light.  Neck: Normal range of motion. Neck supple. No JVD present. No tracheal deviation present. No thyromegaly present.  Cardiovascular: Normal rate, regular rhythm, normal heart sounds and intact distal pulses.  Exam reveals no gallop and no friction rub.   No murmur heard. Pulmonary/Chest: Effort normal and breath sounds normal. No stridor. No respiratory distress. She has no wheezes. She has no rales. She exhibits tenderness (tenderness to left sternal costal margin.  No crepitus no step-off, no overlying skin changes).  Abdominal: Soft. Bowel sounds are normal. She exhibits no distension and no mass. There is no tenderness. There is no rebound and no guarding.  Musculoskeletal: Normal range of motion. She exhibits no edema or tenderness.  Lymphadenopathy:    She has no cervical adenopathy.  Neurological: She is alert and oriented to person, place, and time. She displays normal reflexes. She exhibits normal muscle tone. Coordination normal.  Skin: Skin is warm and dry. No rash noted. No erythema. No pallor.  Psychiatric: She has a normal mood and affect. Her behavior is normal. Judgment and thought content normal.  Nursing note and vitals reviewed.   ED Course  Procedures (including critical care time)  COORDINATION OF CARE: 3:27 AM- Discussed treatment plan with patient at bedside and patient agreed to plan.   Labs Review Labs Reviewed  BASIC METABOLIC PANEL - Abnormal; Notable for the following:    Potassium 3.3 (*)    Glucose, Bld 101 (*)    All other components within normal limits  CBC WITH DIFFERENTIAL/PLATELET   Rosezena SensorI-STAT TROPOININ, ED    Imaging Review Dg Chest 2 View  07/24/2014   CLINICAL DATA:  Acute onset of left-sided chest pain and shortness of breath. Initial encounter.  EXAM: CHEST  2 VIEW  COMPARISON:  None.  FINDINGS: The lungs are well-aerated and clear. There is no evidence of focal opacification, pleural effusion or pneumothorax.  The heart is normal in size; the mediastinal contour is within normal limits. No acute osseous abnormalities are seen.  IMPRESSION: No acute cardiopulmonary process seen.   Electronically Signed   By: Roanna RaiderJeffery  Chang M.D.   On: 07/24/2014 02:49     EKG Interpretation   Date/Time:  Monday Jul 24 2014 01:57:42 EDT Ventricular Rate:  90 PR Interval:  164 QRS Duration: 80 QT Interval:  384 QTC Calculation: 469 R Axis:   82 Text Interpretation:  Normal sinus rhythm Right atrial enlargement  Borderline ECG Confirmed by Wylder Macomber  MD, Kazoua Gossen ([id number]) on 07/24/2014 2:14:11  AM      MDM   Final diagnoses:  Chest pain with low risk for cardiac etiology  Chest  wall pain    34 year old female with left parasternal chest pain which is since resolved.  She has had frequent episodes of similar in past.  She has had prior evaluation by cardiology.  She's been told by Dr. in the past that she should not wait, but comes directly to the ER when she has these pains.  Her workup today is unremarkable.  She is PERC negative.  Her EKG is unremarkable.  Her chest x-ray is negative.  I have advised the patient in the future that she can try Tylenol or Motrin and warm compresses before coming to the emergency department. I personally performed the services described in this documentation, which was scribed in my presence. The recorded information has been reviewed and is accurate.    Sherry Severin, MD 07/24/14 [phone]

## 2014-07-24 NOTE — ED Notes (Signed)
Pt c/o left side chest pain associated with left arm numbness. Onset was approximately 0145. Pt states pain has decreased since coming to the ED.

## 2014-07-24 NOTE — Discharge Instructions (Signed)
Chest Pain (Nonspecific) °It is often hard to give a specific diagnosis for the cause of chest pain. There is always a chance that your pain could be related to something serious, such as a heart attack or a blood clot in the lungs. You need to follow up with your health care provider for further evaluation. °CAUSES  °· Heartburn. °· Pneumonia or bronchitis. °· Anxiety or stress. °· Inflammation around your heart (pericarditis) or lung (pleuritis or pleurisy). °· A blood clot in the lung. °· A collapsed lung (pneumothorax). It can develop suddenly on its own (spontaneous pneumothorax) or from trauma to the chest. °· Shingles infection (herpes zoster virus). °The chest wall is composed of bones, muscles, and cartilage. Any of these can be the source of the pain. °· The bones can be bruised by injury. °· The muscles or cartilage can be strained by coughing or overwork. °· The cartilage can be affected by inflammation and become sore (costochondritis). °DIAGNOSIS  °Lab tests or other studies may be needed to find the cause of your pain. Your health care provider may have you take a test called an ambulatory electrocardiogram (ECG). An ECG records your heartbeat patterns over a 24-hour period. You may also have other tests, such as: °· Transthoracic echocardiogram (TTE). During echocardiography, sound waves are used to evaluate how blood flows through your heart. °· Transesophageal echocardiogram (TEE). °· Cardiac monitoring. This allows your health care provider to monitor your heart rate and rhythm in real time. °· Holter monitor. This is a portable device that records your heartbeat and can help diagnose heart arrhythmias. It allows your health care provider to track your heart activity for several days, if needed. °· Stress tests by exercise or by giving medicine that makes the heart beat faster. °TREATMENT  °· Treatment depends on what may be causing your chest pain. Treatment may include: °· Acid blockers for  heartburn. °· Anti-inflammatory medicine. °· Pain medicine for inflammatory conditions. °· Antibiotics if an infection is present. °· You may be advised to change lifestyle habits. This includes stopping smoking and avoiding alcohol, caffeine, and chocolate. °· You may be advised to keep your head raised (elevated) when sleeping. This reduces the chance of acid going backward from your stomach into your esophagus. °Most of the time, nonspecific chest pain will improve within 2-3 days with rest and mild pain medicine.  °HOME CARE INSTRUCTIONS  °· If antibiotics were prescribed, take them as directed. Finish them even if you start to feel better. °· For the next few days, avoid physical activities that bring on chest pain. Continue physical activities as directed. °· Do not use any tobacco products, including cigarettes, chewing tobacco, or electronic cigarettes. °· Avoid drinking alcohol. °· Only take medicine as directed by your health care provider. °· Follow your health care provider's suggestions for further testing if your chest pain does not go away. °· Keep any follow-up appointments you made. If you do not go to an appointment, you could develop lasting (chronic) problems with pain. If there is any problem keeping an appointment, call to reschedule. °SEEK MEDICAL CARE IF:  °· Your chest pain does not go away, even after treatment. °· You have a rash with blisters on your chest. °· You have a fever. °SEEK IMMEDIATE MEDICAL CARE IF:  °· You have increased chest pain or pain that spreads to your arm, neck, jaw, back, or abdomen. °· You have shortness of breath. °· You have an increasing cough, or you cough   up blood. °· You have severe back or abdominal pain. °· You feel nauseous or vomit. °· You have severe weakness. °· You faint. °· You have chills. °This is an emergency. Do not wait to see if the pain will go away. Get medical help at once. Call your local emergency services (911 in [place] Do not drive  yourself to the hospital. °MAKE SURE YOU:  °· Understand these instructions. °· Will watch your condition. °· Will get help right away if you are not doing well or get worse. °Document Released: 12/04/2004 Document Revised: 03/01/2013 Document Reviewed: 09/30/2007 °Exit[employer]® Patient Information ©2015 [employer]. This information is not intended to replace advice given to you by your health care provider. Make sure you discuss any questions you have with your health care provider. ° °Chest Wall Pain °Chest wall pain is pain in or around the bones and muscles of your chest. It may take up to 6 weeks to get better. It may take longer if you must stay physically active in your work and activities.  °CAUSES  °Chest wall pain may happen on its own. However, it may be caused by: °· A viral illness like the flu. °· Injury. °· Coughing. °· Exercise. °· Arthritis. °· Fibromyalgia. °· Shingles. °HOME CARE INSTRUCTIONS  °· Avoid overtiring physical activity. Try not to strain or perform activities that cause pain. This includes any activities using your chest or your abdominal and side muscles, especially if heavy weights are used. °· Put ice on the sore area. °¨ Put ice in a plastic bag. °¨ Place a towel between your skin and the bag. °¨ Leave the ice on for 15-20 minutes per hour while awake for the first 2 days. °· Only take over-the-counter or prescription medicines for pain, discomfort, or fever as directed by your caregiver. °SEEK IMMEDIATE MEDICAL CARE IF:  °· Your pain increases, or you are very uncomfortable. °· You have a fever. °· Your chest pain becomes worse. °· You have new, unexplained symptoms. °· You have nausea or vomiting. °· You feel sweaty or lightheaded. °· You have a cough with phlegm (sputum), or you cough up blood. °MAKE SURE YOU:  °· Understand these instructions. °· Will watch your condition. °· Will get help right away if you are not doing well or get worse. °Document Released: 02/24/2005 Document  Revised: 05/19/2011 Document Reviewed: 10/21/2010 °Exit[employer]® Patient Information ©2015 [employer]. This information is not intended to replace advice given to you by your health care provider. Make sure you discuss any questions you have with your health care provider. ° °

## 2014-10-09 ENCOUNTER — Encounter (HOSPITAL_COMMUNITY): Payer: Self-pay | Admitting: Emergency Medicine

## 2014-10-09 ENCOUNTER — Emergency Department (HOSPITAL_COMMUNITY)
Admission: EM | Admit: 2014-10-09 | Discharge: 2014-10-09 | Disposition: A | Discharge status: Home / Self Care | Payer: Medicaid Other | Attending: Emergency Medicine | Admitting: Emergency Medicine

## 2014-10-09 DIAGNOSIS — G43011 Migraine without aura, intractable, with status migrainosus: Secondary | ICD-10-CM

## 2014-10-09 DIAGNOSIS — Z862 Personal history of diseases of the blood and blood-forming organs and certain disorders involving the immune mechanism: Secondary | ICD-10-CM | POA: Insufficient documentation

## 2014-10-09 DIAGNOSIS — Z8719 Personal history of other diseases of the digestive system: Secondary | ICD-10-CM | POA: Insufficient documentation

## 2014-10-09 DIAGNOSIS — R011 Cardiac murmur, unspecified: Secondary | ICD-10-CM | POA: Insufficient documentation

## 2014-10-09 DIAGNOSIS — G43919 Migraine, unspecified, intractable, without status migrainosus: Secondary | ICD-10-CM | POA: Insufficient documentation

## 2014-10-09 DIAGNOSIS — J45909 Unspecified asthma, uncomplicated: Secondary | ICD-10-CM | POA: Insufficient documentation

## 2014-10-09 DIAGNOSIS — R51 Headache: Secondary | ICD-10-CM | POA: Diagnosis present

## 2014-10-09 LAB — I-STAT BETA HCG BLOOD, ED (MC, WL, AP ONLY)

## 2014-10-09 MED ORDER — PROCHLORPERAZINE EDISYLATE 5 MG/ML IJ SOLN
10.0000 mg | Freq: Once | INTRAMUSCULAR | Status: AC
  Administered 2014-10-09: 10 mg via INTRAVENOUS
  Filled 2014-10-09: qty 2

## 2014-10-09 MED ORDER — KETOROLAC TROMETHAMINE 30 MG/ML IJ SOLN
30.0000 mg | Freq: Once | INTRAMUSCULAR | Status: AC
  Administered 2014-10-09: 30 mg via INTRAVENOUS
  Filled 2014-10-09: qty 1

## 2014-10-09 MED ORDER — SODIUM CHLORIDE 0.9 % IV BOLUS (SEPSIS)
1000.0000 mL | Freq: Once | INTRAVENOUS | Status: AC
  Administered 2014-10-09: 1000 mL via INTRAVENOUS

## 2014-10-09 MED ORDER — DIPHENHYDRAMINE HCL 50 MG/ML IJ SOLN
25.0000 mg | Freq: Once | INTRAMUSCULAR | Status: AC
  Administered 2014-10-09: 25 mg via INTRAVENOUS
  Filled 2014-10-09: qty 1

## 2014-10-09 MED ORDER — DEXAMETHASONE SODIUM PHOSPHATE 10 MG/ML IJ SOLN
10.0000 mg | Freq: Once | INTRAMUSCULAR | Status: AC
  Administered 2014-10-09: 10 mg via INTRAVENOUS
  Filled 2014-10-09: qty 1

## 2014-10-09 NOTE — Discharge Instructions (Signed)

## 2014-10-09 NOTE — ED Provider Notes (Signed)
CSN: [id number]     Arrival date & time 10/09/14  1900 History   First MD Initiated Contact with Patient 10/09/14 1923     Chief Complaint  Patient presents with  . Migraine     (Consider location/radiation/quality/duration/timing/severity/associated sxs/prior Treatment) Patient is a 34 y.o. female presenting with migraines. The history is provided by the patient.  Migraine This is a recurrent problem. The current episode started 6 to 12 hours ago. The problem occurs constantly. The problem has been gradually worsening. Associated symptoms include headaches. Pertinent negatives include no chest pain and no shortness of breath. Exacerbated by: Bright lights and loud noises. Relieved by: Resting in a dark room. She has tried nothing for the symptoms. The treatment provided no relief.    34 yo F with a chief with headache. Patient says she's had similar headaches to this though this was most worse. Pain is right sided associated with photophobia and phonophobia. Patient denies any fevers or chills denies any neck pain. Headache was slow in onset started this morning and photophobia has gotten so bad that she can't open her eyes anymore.  Past Medical History  Diagnosis Date  . Asthma   . GERD (gastroesophageal reflux disease)   . Allergy   . Heart murmur   . Headache(784.0)   . Anemia    Past Surgical History  Procedure Laterality Date  . Cesarean section    . Cesarean section N/A 02/08/2014    Procedure: REPEAT  CESAREAN SECTION;  Surgeon: Antionette Char, MD;  Location: [facility];  Service: Obstetrics;  Laterality: N/A;   Family History  Problem Relation Age of Onset  . Diabetes Other   . Cancer Other   . Cancer Mother   . Heart defect Daughter   . Heart disease Maternal Grandmother   . Arthritis Maternal Grandmother   . Cancer Maternal Grandmother   . Diabetes Maternal Grandmother   . Hyperlipidemia Maternal Grandmother   . Depression Sister   . COPD Maternal Grandfather   .  Arthritis Paternal Grandmother    History  Substance Use Topics  . Smoking status: Never Smoker   . Smokeless tobacco: Never Used  . Alcohol Use: No   OB History    Gravida Para Term Preterm AB TAB SAB Ectopic Multiple Living   0 3     Review of Systems  Constitutional: Negative for fever and chills.  HENT: Negative for congestion and rhinorrhea.   Eyes: Positive for photophobia. Negative for redness and visual disturbance.  Respiratory: Negative for shortness of breath and wheezing.   Cardiovascular: Negative for chest pain and palpitations.  Gastrointestinal: Negative for nausea and vomiting.  Genitourinary: Negative for dysuria and urgency.  Musculoskeletal: Negative for myalgias and arthralgias.  Skin: Negative for pallor and wound.  Neurological: Positive for headaches. Negative for dizziness.      Allergies  Shrimp and Iodinated diagnostic agents  Home Medications   Prior to Admission medications   Medication Sig Start Date End Date Taking? Authorizing Provider  albuterol (PROVENTIL HFA;VENTOLIN HFA) 108 (90 BASE) MCG/ACT inhaler Inhale 2 puffs into the lungs every 6 (six) hours as needed for wheezing or shortness of breath. Asthma   Yes Historical Provider, MD  naproxen (NAPROSYN) 500 MG tablet Take 1 tablet (500 mg total) by mouth 2 (two) times daily as needed for mild pain or moderate pain. Patient not taking: Reported on 10/09/2014 07/24/14   Marisa Severin, MD   BP  113/72 mmHg  Pulse 63  Temp(Src) 98.8 F (37.1 C)  Resp 16  SpO2 100%  LMP 09/27/2014 Physical Exam  Constitutional: She is oriented to person, place, and time. She appears well-developed and well-nourished. No distress.  HENT:  Head: Normocephalic and atraumatic.  Eyes: EOM are normal. Pupils are equal, round, and reactive to light.  Neck: Normal range of motion. Neck supple.  Cardiovascular: Normal rate and regular rhythm.  Exam reveals no gallop and no friction rub.   No murmur  heard. Pulmonary/Chest: Effort normal. She has no wheezes. She has no rales.  Abdominal: Soft. She exhibits no distension and no mass. There is no tenderness. There is no rebound and no guarding.  Musculoskeletal: She exhibits no edema or tenderness.  Neurological: She is alert and oriented to person, place, and time. She has normal strength. No cranial nerve deficit or sensory deficit. GCS eye subscore is 4. GCS verbal subscore is 5. GCS motor subscore is 6. She displays no Babinski's sign on the right side. She displays no Babinski's sign on the left side.  Reflex Scores:      Tricep reflexes are 2+ on the right side and 2+ on the left side.      Bicep reflexes are 2+ on the right side and 2+ on the left side.      Brachioradialis reflexes are 2+ on the right side and 2+ on the left side.      Patellar reflexes are 2+ on the right side and 2+ on the left side.      Achilles reflexes are 2+ on the right side and 2+ on the left side. Skin: Skin is warm and dry. She is not diaphoretic.  Psychiatric: She has a normal mood and affect. Her behavior is normal.    ED Course  Procedures (including critical care time) Labs Review Labs Reviewed  I-STAT BETA HCG BLOOD, ED ([facility], [facility], [facility] ONLY)    Imaging Review No results found.   EKG Interpretation None      MDM   Final diagnoses:  Intractable migraine without aura and with status migrainosus    34 yo F with a chief complaint of migraine headache. We'll treat with migraine cocktail reassess.    Patient's headache mostly relieved with migraine cocktail. Patient able to open her eyes that she is able to see again. Requesting discharge. The patient home while asleep off the rest of her headache. Given follow-up for her neurologist.  9:39 PM:  I have discussed the diagnosis/risks/treatment options with the patient and family and believe the pt to be eligible for discharge home to follow-up with PCP, neurology. We also discussed returning to  the ED immediately if new or worsening sx occur. We discussed the sx which are most concerning (e.g., worsening headache, fever, neck pain) that necessitate immediate return. Medications administered to the patient during their visit and any new prescriptions provided to the patient are listed below.  Medications given during this visit Medications  prochlorperazine (COMPAZINE) injection 10 mg (10 mg Intravenous Given 10/09/14 2012)  ketorolac (TORADOL) 30 MG/ML injection 30 mg (30 mg Intravenous Given 10/09/14 2010)  diphenhydrAMINE (BENADRYL) injection 25 mg (25 mg Intravenous Given 10/09/14 2006)  sodium chloride 0.9 % bolus 1,000 mL (1,000 mLs Intravenous New Bag/Given 10/09/14 2015)  dexamethasone (DECADRON) injection 10 mg (10 mg Intravenous Given 10/09/14 2007)    New Prescriptions   No medications on file     The patient appears reasonably screen and/or stabilized for  discharge and I doubt any other medical condition or other [facility] requiring further screening, evaluation, or treatment in the ED at this time prior to discharge.    Melene Plan, DO 10/09/14 2139

## 2014-10-09 NOTE — ED Notes (Addendum)
Patient states she has a migraine that started today (around early afternoon) associated with blurred vision.  Around 6:30 pm she started seeing white patches and blurred vision. .  She has frontal and posterior lobe tigthness.  She denies any N/V.  Denies any falls or injuries.  No LOC.  She does not have a history of migraines.   Patient states she has not had any seizure activity.

## 2014-12-04 ENCOUNTER — Ambulatory Visit: Payer: Medicaid Other | Admitting: Neurology

## 2014-12-20 ENCOUNTER — Ambulatory Visit (INDEPENDENT_AMBULATORY_CARE_PROVIDER_SITE_OTHER): Payer: Medicaid Other | Admitting: Certified Nurse Midwife

## 2014-12-20 ENCOUNTER — Encounter: Payer: Self-pay | Admitting: Certified Nurse Midwife

## 2014-12-20 VITALS — BP 106/70 | HR 85 | Temp 98.7°F | Wt 134.0 lb

## 2014-12-20 DIAGNOSIS — R634 Abnormal weight loss: Secondary | ICD-10-CM

## 2014-12-20 DIAGNOSIS — Z30018 Encounter for initial prescription of other contraceptives: Secondary | ICD-10-CM

## 2014-12-20 DIAGNOSIS — Z01419 Encounter for gynecological examination (general) (routine) without abnormal findings: Secondary | ICD-10-CM

## 2014-12-20 DIAGNOSIS — Z0000 Encounter for general adult medical examination without abnormal findings: Secondary | ICD-10-CM | POA: Diagnosis not present

## 2014-12-20 DIAGNOSIS — Z113 Encounter for screening for infections with a predominantly sexual mode of transmission: Secondary | ICD-10-CM

## 2014-12-20 DIAGNOSIS — N760 Acute vaginitis: Secondary | ICD-10-CM

## 2014-12-20 DIAGNOSIS — R636 Underweight: Secondary | ICD-10-CM

## 2014-12-20 DIAGNOSIS — L309 Dermatitis, unspecified: Secondary | ICD-10-CM

## 2014-12-20 LAB — COMPREHENSIVE METABOLIC PANEL
ALK PHOS: 48 U/L (ref 33–115)
ALT: 21 U/L (ref 6–29)
AST: 23 U/L (ref 10–30)
Albumin: 4.7 g/dL (ref 3.6–5.1)
BILIRUBIN TOTAL: 1.6 mg/dL — AB (ref 0.2–1.2)
BUN: 6 mg/dL — AB (ref 7–25)
CO2: 27 mmol/L (ref 20–31)
CREATININE: 0.69 mg/dL (ref 0.50–1.10)
Calcium: 9.2 mg/dL (ref 8.6–10.2)
Chloride: 103 mmol/L (ref 98–110)
Glucose, Bld: 78 mg/dL (ref 65–99)
POTASSIUM: 3.9 mmol/L (ref 3.5–5.3)
Sodium: 138 mmol/L (ref 135–146)
TOTAL PROTEIN: 7.4 g/dL (ref 6.1–8.1)

## 2014-12-20 LAB — CBC WITH DIFFERENTIAL/PLATELET
BASOS ABS: 0 10*3/uL (ref 0.0–0.1)
Basophils Relative: 1 % (ref 0–1)
EOS ABS: 0.1 10*3/uL (ref 0.0–0.7)
Eosinophils Relative: 2 % (ref 0–5)
HCT: 39.7 % (ref 36.0–46.0)
Hemoglobin: 12.9 g/dL (ref 12.0–15.0)
LYMPHS PCT: 24 % (ref 12–46)
Lymphs Abs: 1.1 10*3/uL (ref 0.7–4.0)
MCH: 30.1 pg (ref 26.0–34.0)
MCHC: 32.5 g/dL (ref 30.0–36.0)
MCV: 92.5 fL (ref 78.0–100.0)
MPV: 13.7 fL — AB (ref 8.6–12.4)
Monocytes Absolute: 0.3 10*3/uL (ref 0.1–1.0)
Monocytes Relative: 6 % (ref 3–12)
NEUTROS PCT: 67 % (ref 43–77)
Neutro Abs: 3 10*3/uL (ref 1.7–7.7)
PLATELETS: 199 10*3/uL (ref 150–400)
RBC: 4.29 MIL/uL (ref 3.87–5.11)
RDW: 14.3 % (ref 11.5–15.5)
WBC: 4.5 10*3/uL (ref 4.0–10.5)

## 2014-12-20 MED ORDER — TINIDAZOLE 500 MG PO TABS: 2.0000 g | ORAL_TABLET | Freq: Every day | ORAL | Status: AC
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
## 2014-12-21 LAB — TSH: TSH: 1.061 u[IU]/mL (ref 0.350–4.500)

## 2014-12-21 LAB — RPR

## 2014-12-21 LAB — HIV ANTIBODY (ROUTINE TESTING W REFLEX): HIV 1&2 Ab, 4th Generation: NONREACTIVE

## 2014-12-21 LAB — HEPATITIS B SURFACE ANTIGEN: Hepatitis B Surface Ag: NEGATIVE

## 2014-12-21 LAB — HEPATITIS C ANTIBODY: HCV Ab: NEGATIVE

## 2014-12-21 MED ORDER — ETONOGESTREL-ETHINYL ESTRADIOL 0.12-0.015 MG/24HR VA RING: 1.0000 | VAGINAL_RING | VAGINAL | Status: DC

## 2014-12-21 MED ORDER — PIMECROLIMUS 1 % EX CREA: TOPICAL_CREAM | CUTANEOUS | Status: DC

## 2014-12-23 LAB — PAP, TP IMAGING W/ HPV RNA, RFLX HPV TYPE 16,18/45: HPV MRNA, HIGH RISK: NOT DETECTED

## 2014-12-25 LAB — SURESWAB, VAGINOSIS/VAGINITIS PLUS
Atopobium vaginae: 7.6 Log (cells/mL)
C. ALBICANS, DNA: NOT DETECTED
C. GLABRATA, DNA: NOT DETECTED
C. PARAPSILOSIS, DNA: DETECTED — AB
C. TROPICALIS, DNA: NOT DETECTED
C. trachomatis RNA, TMA: NOT DETECTED
Gardnerella vaginalis: >8 Log (cells/mL)
LACTOBACILLUS SPECIES: NOT DETECTED Log (cells/mL)
N. GONORRHOEAE RNA, TMA: NOT DETECTED
T. vaginalis RNA, QL TMA: NOT DETECTED

## 2014-12-26 ENCOUNTER — Encounter: Payer: Self-pay | Admitting: Certified Nurse Midwife

## 2014-12-26 ENCOUNTER — Other Ambulatory Visit: Payer: Self-pay | Admitting: Certified Nurse Midwife

## 2014-12-26 ENCOUNTER — Ambulatory Visit (INDEPENDENT_AMBULATORY_CARE_PROVIDER_SITE_OTHER): Payer: Medicaid Other | Admitting: Certified Nurse Midwife

## 2014-12-26 VITALS — BP 114/82 | HR 77 | Temp 98.3°F | Wt 132.0 lb

## 2014-12-26 DIAGNOSIS — Z01812 Encounter for preprocedural laboratory examination: Secondary | ICD-10-CM

## 2014-12-26 DIAGNOSIS — Z3049 Encounter for surveillance of other contraceptives: Secondary | ICD-10-CM

## 2014-12-26 DIAGNOSIS — B9689 Other specified bacterial agents as the cause of diseases classified elsewhere: Secondary | ICD-10-CM

## 2014-12-26 DIAGNOSIS — N760 Acute vaginitis: Secondary | ICD-10-CM

## 2014-12-26 MED ORDER — FLUCONAZOLE 100 MG PO TABS: 100.0000 mg | ORAL_TABLET | Freq: Once | ORAL | Status: DC

## 2014-12-26 MED ORDER — METRONIDAZOLE 500 MG PO TABS: 500.0000 mg | ORAL_TABLET | Freq: Two times a day (BID) | ORAL | Status: DC

## 2014-12-26 MED ORDER — TERCONAZOLE 0.4 % VA CREA: 1.0000 | TOPICAL_CREAM | Freq: Every day | VAGINAL | Status: DC

## 2014-12-27 NOTE — Progress Notes (Signed)
Patient ID: Sherry BaldSandra [name], female   DOB: 11/13/1980, 34 y.o.   MRN: [id number]  Nexplanon Procedure Note   PRE-OP DIAGNOSIS: desired long-term, reversible contraception  POST-OP DIAGNOSIS: Same  PROCEDURE: Nexplanon  placement Performing Provider: Orvilla Cornwallachelle Tysin Salada, CNM   Patient education prior to procedure, explained risk, benefits of Nexplanon, reviewed alternative options. Patient reported understanding. Gave consent to continue with procedure.   PROCEDURE:  Pregnancy Text :  Negative Site (check):      left arm         Sterile Preparation:   Hibiclens-Alcoholx3 Lot # [id number] Expiration Date 02/2017  Insertion site was selected 8 - 10 cm from medial epicondyle and marked along with guiding site using sterile marker. Procedure area was prepped and draped in a sterile fashion. 1% Lidocaine 1.5 ml given prior to procedure. Nexplanon  was inserted subcutaneously.Needle was removed from the insertion site. Nexplanon capsule was palpated by provider and patient to assure satisfactory placement. Dressing applied.  Followup: The patient tolerated the procedure well without complications.  Standard post-procedure care is explained and return precautions are given.  Orvilla Cornwallachelle Sencere Symonette, CNM

## 2015-01-15 ENCOUNTER — Emergency Department (HOSPITAL_COMMUNITY)
Admission: EM | Admit: 2015-01-15 | Discharge: 2015-01-15 | Disposition: A | Discharge status: Home / Self Care | Payer: Medicaid Other | Attending: Emergency Medicine | Admitting: Emergency Medicine

## 2015-01-15 ENCOUNTER — Emergency Department (HOSPITAL_COMMUNITY): Payer: Medicaid Other

## 2015-01-15 ENCOUNTER — Encounter (HOSPITAL_COMMUNITY): Payer: Self-pay | Admitting: *Deleted

## 2015-01-15 DIAGNOSIS — S99912A Unspecified injury of left ankle, initial encounter: Secondary | ICD-10-CM | POA: Insufficient documentation

## 2015-01-15 DIAGNOSIS — Y9289 Other specified places as the place of occurrence of the external cause: Secondary | ICD-10-CM | POA: Insufficient documentation

## 2015-01-15 DIAGNOSIS — S62614A Displaced fracture of proximal phalanx of right ring finger, initial encounter for closed fracture: Secondary | ICD-10-CM | POA: Insufficient documentation

## 2015-01-15 DIAGNOSIS — Y998 Other external cause status: Secondary | ICD-10-CM | POA: Insufficient documentation

## 2015-01-15 DIAGNOSIS — S62652A Nondisplaced fracture of medial phalanx of right middle finger, initial encounter for closed fracture: Secondary | ICD-10-CM | POA: Insufficient documentation

## 2015-01-15 DIAGNOSIS — R011 Cardiac murmur, unspecified: Secondary | ICD-10-CM | POA: Insufficient documentation

## 2015-01-15 DIAGNOSIS — Z79818 Long term (current) use of other agents affecting estrogen receptors and estrogen levels: Secondary | ICD-10-CM | POA: Diagnosis not present

## 2015-01-15 DIAGNOSIS — Z79899 Other long term (current) drug therapy: Secondary | ICD-10-CM | POA: Insufficient documentation

## 2015-01-15 DIAGNOSIS — Y9389 Activity, other specified: Secondary | ICD-10-CM | POA: Insufficient documentation

## 2015-01-15 DIAGNOSIS — J45909 Unspecified asthma, uncomplicated: Secondary | ICD-10-CM | POA: Insufficient documentation

## 2015-01-15 DIAGNOSIS — S62309A Unspecified fracture of unspecified metacarpal bone, initial encounter for closed fracture: Secondary | ICD-10-CM

## 2015-01-15 DIAGNOSIS — S62630A Displaced fracture of distal phalanx of right index finger, initial encounter for closed fracture: Secondary | ICD-10-CM | POA: Insufficient documentation

## 2015-01-15 DIAGNOSIS — Z8719 Personal history of other diseases of the digestive system: Secondary | ICD-10-CM | POA: Insufficient documentation

## 2015-01-15 DIAGNOSIS — Z862 Personal history of diseases of the blood and blood-forming organs and certain disorders involving the immune mechanism: Secondary | ICD-10-CM | POA: Diagnosis not present

## 2015-01-15 DIAGNOSIS — W01198A Fall on same level from slipping, tripping and stumbling with subsequent striking against other object, initial encounter: Secondary | ICD-10-CM | POA: Insufficient documentation

## 2015-01-15 DIAGNOSIS — S6992XA Unspecified injury of left wrist, hand and finger(s), initial encounter: Secondary | ICD-10-CM | POA: Diagnosis present

## 2015-01-15 MED ORDER — HYDROCODONE-ACETAMINOPHEN 5-325 MG PO TABS: 1.0000 | ORAL_TABLET | ORAL | Status: DC | PRN

## 2015-01-15 NOTE — Progress Notes (Signed)
Orthopedic Tech Progress Note Patient Details:  Colonel BaldSandra Luttrull 09/25/1980 [id number]  Ortho Devices Type of Ortho Device: Ace wrap, Volar splint Ortho Device/Splint Location: LUE Ortho Device/Splint Interventions: Ordered, Application   Jennye MoccasinHughes, Marit Goodwill Craig 01/15/2015, 6:55 PM
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
## 2015-01-15 NOTE — Discharge Instructions (Signed)
Please follow-up with orthopedics next week for further evaluation and management of your symptoms. Take her pain medicines as prescribed, do not take these medications while driving or machinery. Return to ED for new or worsening symptoms.  Cast or Splint Care Casts and splints support injured limbs and keep bones from moving while they heal. It is important to care for your cast or splint at home.  HOME CARE INSTRUCTIONS  Keep the cast or splint uncovered during the drying period. It can take 24 to 48 hours to dry if it is made of plaster. A fiberglass cast will dry in less than 1 hour.  Do not rest the cast on anything harder than a pillow for the first 24 hours.  Do not put weight on your injured limb or apply pressure to the cast until your health care provider gives you permission.  Keep the cast or splint dry. Wet casts or splints can lose their shape and may not support the limb as well. A wet cast that has lost its shape can also create harmful pressure on your skin when it dries. Also, wet skin can become infected.  Cover the cast or splint with a plastic bag when bathing or when out in the rain or snow. If the cast is on the trunk of the body, take sponge baths until the cast is removed.  If your cast does become wet, dry it with a towel or a blow dryer on the cool setting only.  Keep your cast or splint clean. Soiled casts may be wiped with a moistened cloth.  Do not place any hard or soft foreign objects under your cast or splint, such as cotton, toilet paper, lotion, or powder.  Do not try to scratch the skin under the cast with any object. The object could get stuck inside the cast. Also, scratching could lead to an infection. If itching is a problem, use a blow dryer on a cool setting to relieve discomfort.  Do not trim or cut your cast or remove padding from inside of it.  Exercise all joints next to the injury that are not immobilized by the cast or splint. For example,  if you have a long leg cast, exercise the hip joint and toes. If you have an arm cast or splint, exercise the shoulder, elbow, thumb, and fingers.  Elevate your injured arm or leg on 1 or 2 pillows for the first 1 to 3 days to decrease swelling and pain.It is best if you can comfortably elevate your cast so it is higher than your heart. SEEK MEDICAL CARE IF:   Your cast or splint cracks.  Your cast or splint is too tight or too loose.  You have unbearable itching inside the cast.  Your cast becomes wet or develops a soft spot or area.  You have a bad smell coming from inside your cast.  You get an object stuck under your cast.  Your skin around the cast becomes red or raw.  You have new pain or worsening pain after the cast has been applied. SEEK IMMEDIATE MEDICAL CARE IF:   You have fluid leaking through the cast.  You are unable to move your fingers or toes.  You have discolored (blue or white), cool, painful, or very swollen fingers or toes beyond the cast.  You have tingling or numbness around the injured area.  You have severe pain or pressure under the cast.  You have any difficulty with your breathing or have  shortness of breath.  You have chest pain.   This information is not intended to replace advice given to you by your health care provider. Make sure you discuss any questions you have with your health care provider.   Document Released: 02/22/2000 Document Revised: 12/15/2012 Document Reviewed: 09/02/2012 Elsevier Interactive Patient Education [employer]2016 [employer] Inc.
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
## 2015-01-25 ENCOUNTER — Ambulatory Visit: Payer: Medicaid Other | Admitting: Obstetrics

## 2015-04-03 ENCOUNTER — Ambulatory Visit: Payer: Medicaid Other | Admitting: Neurology

## 2015-08-20 ENCOUNTER — Other Ambulatory Visit: Payer: Self-pay | Admitting: Certified Nurse Midwife

## 2015-08-20 ENCOUNTER — Ambulatory Visit: Payer: Medicaid Other | Admitting: Obstetrics

## 2015-08-20 NOTE — Telephone Encounter (Signed)
Review for refill. 

## 2015-09-04 ENCOUNTER — Other Ambulatory Visit: Payer: Self-pay | Admitting: Certified Nurse Midwife

## 2015-09-04 DIAGNOSIS — L309 Dermatitis, unspecified: Secondary | ICD-10-CM

## 2015-11-16 ENCOUNTER — Telehealth: Payer: Self-pay | Admitting: *Deleted

## 2015-11-16 NOTE — Telephone Encounter (Signed)
Placed call to pt. LM on VM to call office.   Pt has been prescribed Elidel Cream, and needs prior approval.  Call was placed to Medicaid regarding [place]. I was informed by Medicaid that pt now currently has Family Planning and Rx will not be covered.  Attempted to contact pt to make her aware.

## 2015-11-16 NOTE — Telephone Encounter (Signed)
Pt called back to office regarding Rx. Pt was made aware that her Medicaid status has changed. Pt advised to see case worker regarding her Medicaid. Pt made aware that Rx will not be covered with Family Planning.  Pt advised that she may use Eucerin cream, Aquafor or Cetaphil for her eczema.  Pt advised if her Medicaid status changes to make office aware in order to process her Elidel Rx.

## 2015-12-05 IMAGING — US US MFM OB TRANSVAGINAL
1 series · 12 of 28 positions shown · non-contrast
Comparison: none

[Series 1: us mfm ob transvaginal · 0.21mm/px · 12 of 95 slices shown]
[im 4/95]
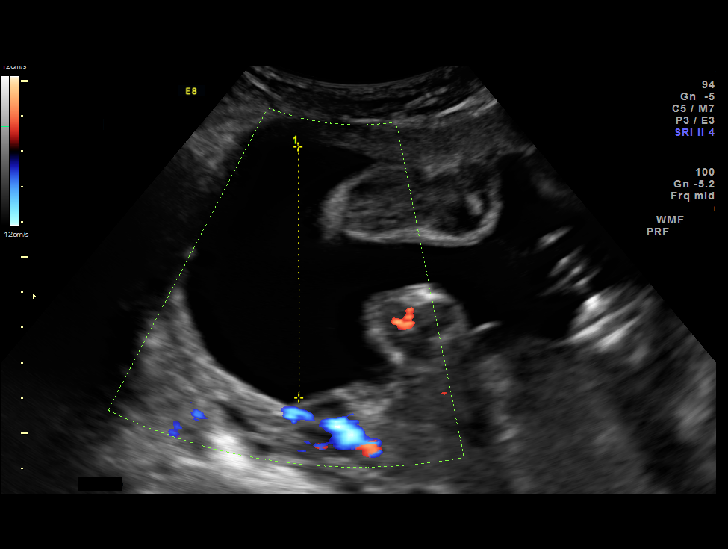
[im 11/95]
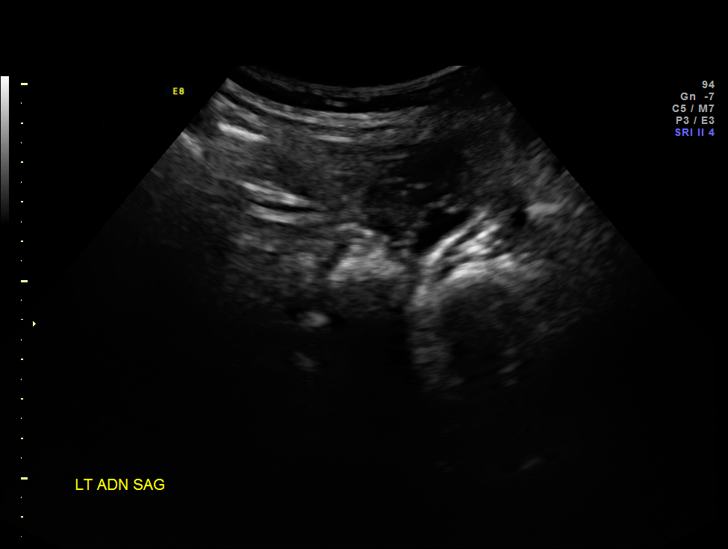
[im 18/95]
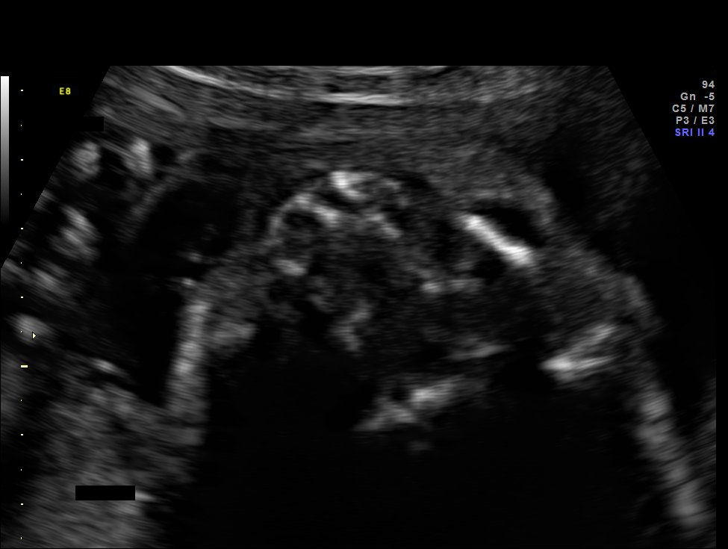
[im 28/95]
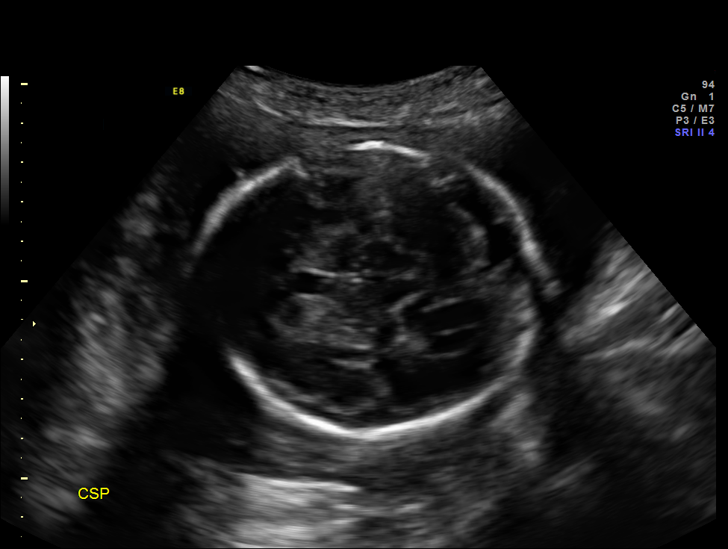
[im 35/95]
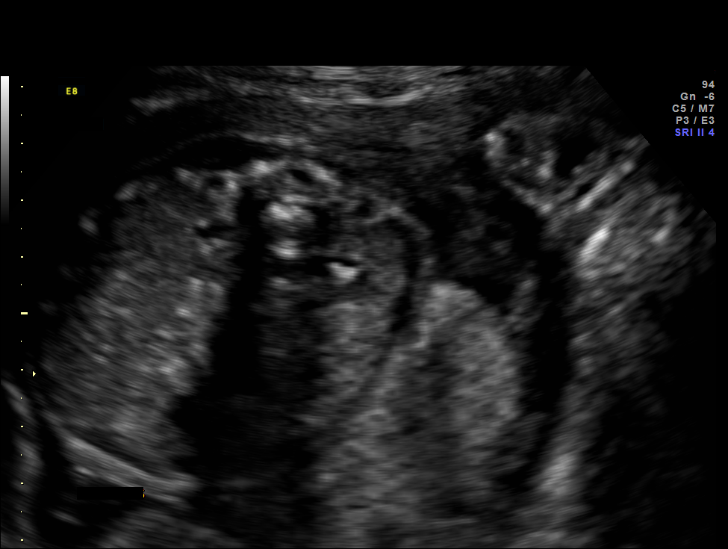
[im 42/95]
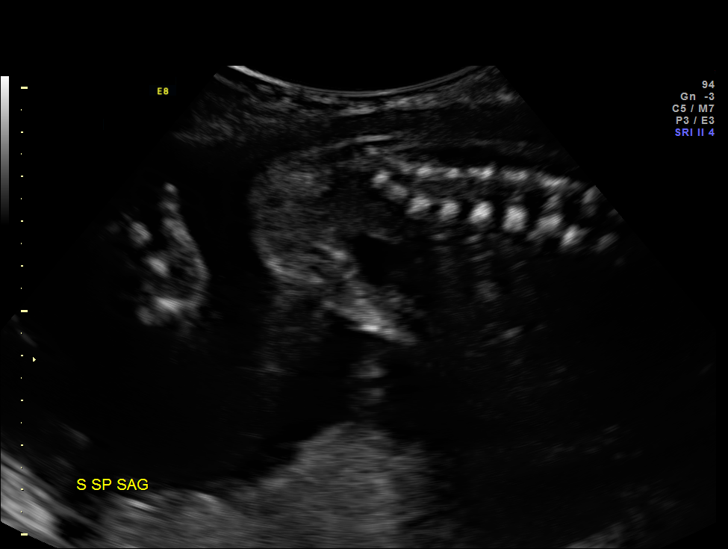
[im 53/95]
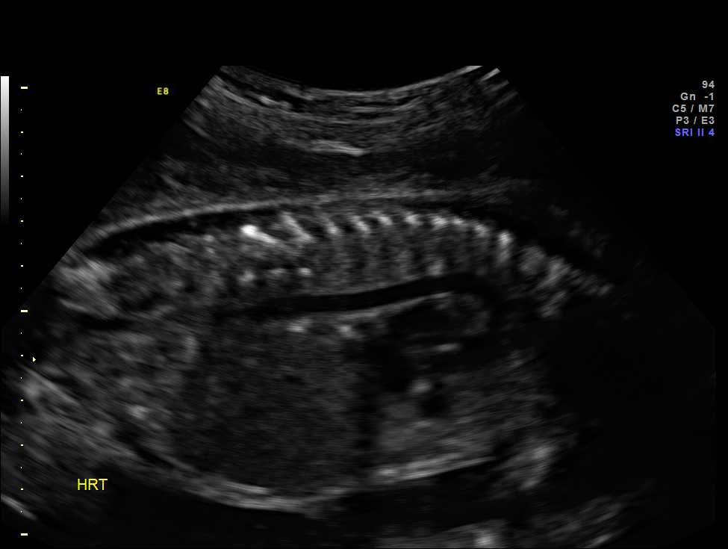
[im 60/95]
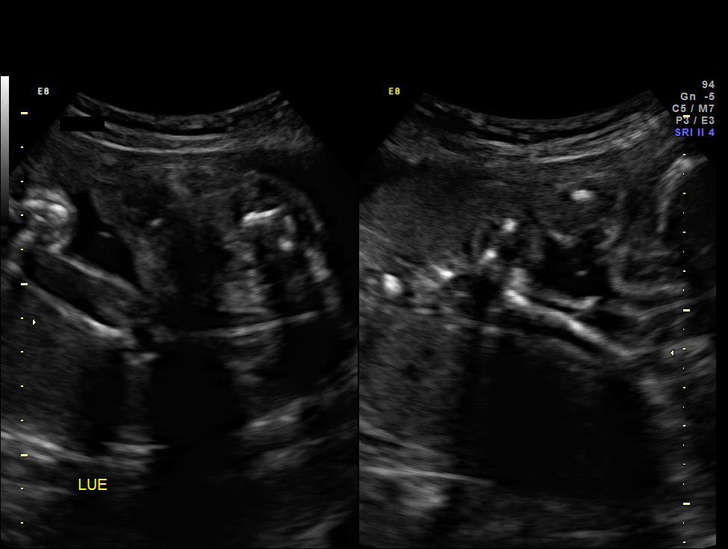
[im 67/95]
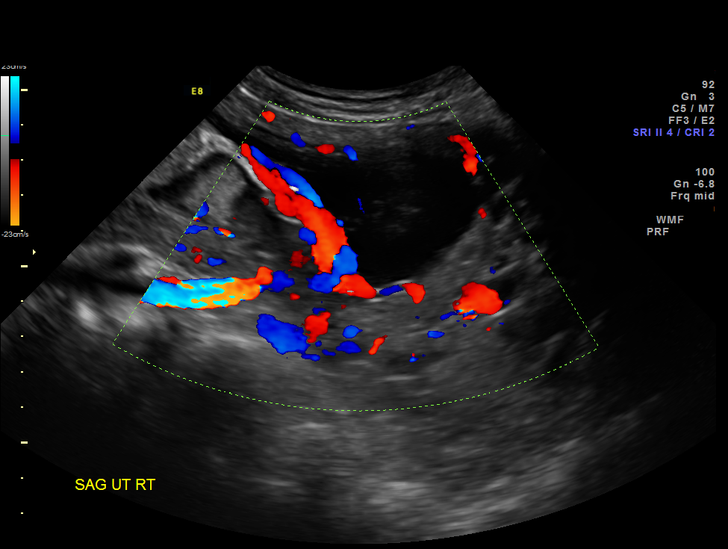
[im 77/95]
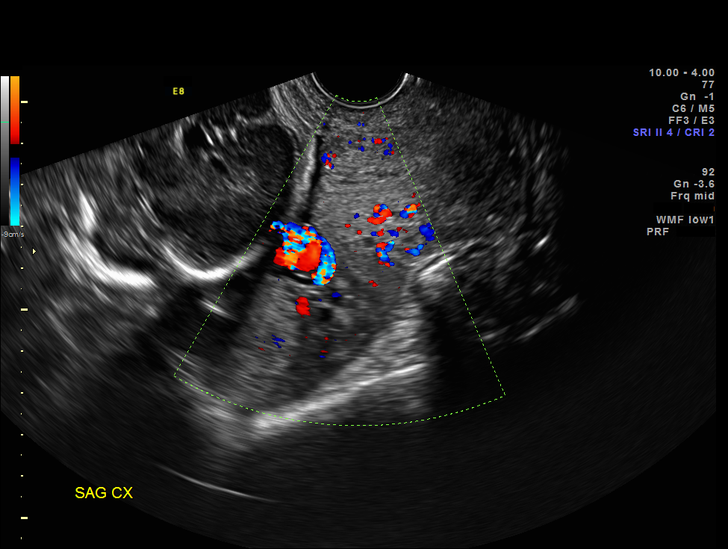
[im 84/95]
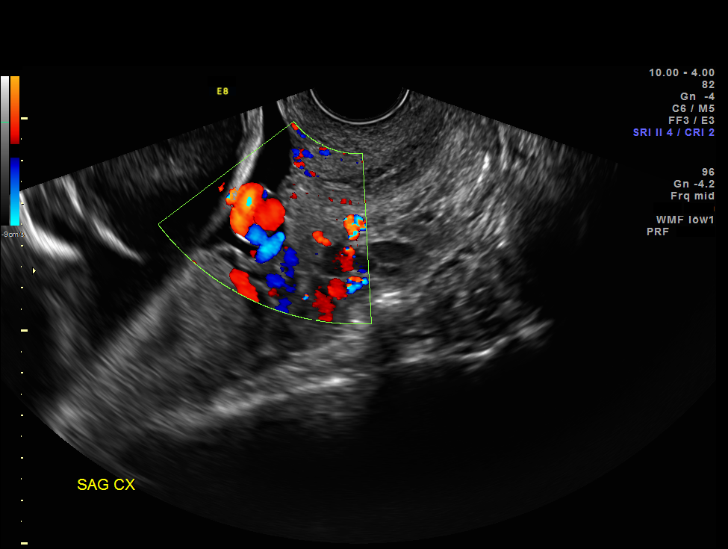
[im 91/95]
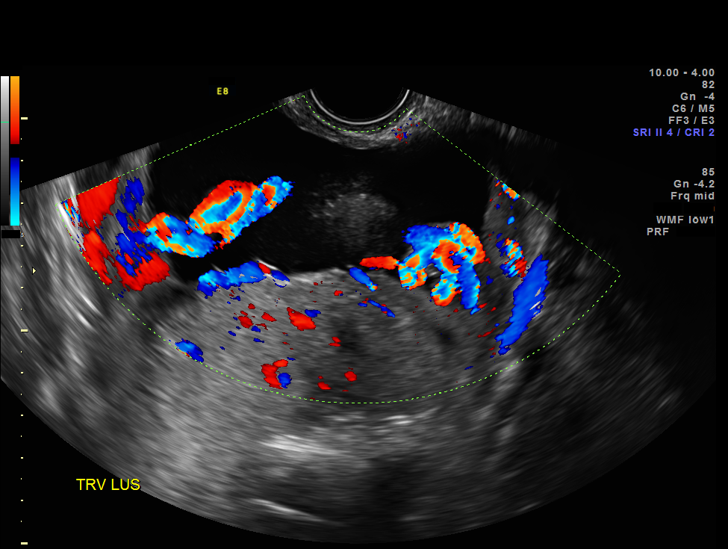

[12 of 28 positions shown; findings below may reference images not displayed]

OBSTETRICS REPORT
                      (Signed Final 12/16/2013 [DATE])

Service(s) Provided

 US OB DETAIL + 14 WK                                  76811.0
 US MFM OB TRANSVAGINAL                                76817.2
Indications

 Detailed fetal anatomic survey                        Z36
 History of genetic / anatomic abnormality - CHD
 (pt's daughter)
 Thrombocytopenia, gestational
 History of cesarean delivery, currently pregnant
 Placenta previa/Low lying: No bleeding
 27 weeks gestation of pregnancy
Fetal Evaluation

 Num Of Fetuses:    1
 Fetal Heart Rate:  148                          bpm
 Cardiac Activity:  Observed
 Presentation:      Cephalic
 Placenta:          Right lateral, above
                    cervical os
 P. Cord            Velamentous ins, vasa
 Insertion:
                    previa

 Amniotic Fluid
 AFI FV:      Subjectively within normal limits
                                             Larg Pckt:     7.1  cm
Biometry

 BPD:     74.7  mm     G. Age:  30w 0d                CI:         82.8   70 - 86
 OFD:     90.2  mm                                    FL/HC:      20.0   18.6 -

 HC:     264.8  mm     G. Age:  28w 6d       76  %    HC/AC:      1.23   1.05 -

 AC:     215.3  mm     G. Age:  26w 0d       14  %    FL/BPD:     71.0   71 - 87
 FL:        53  mm     G. Age:  28w 1d       66  %    FL/AC:      24.6   20 - 24
 HUM:     46.3  mm     G. Age:  27w 2d       50  %

 Est. FW:    3383  gm      2 lb 5 oz     56  %
Gestational Age

 LMP:           28w 0d        Date:  06/03/13                 EDD:   03/10/14
 U/S Today:     28w 2d                                        EDD:   03/08/14
 Best:          27w 1d     Det. By:  Early Ultrasound         EDD:   03/16/14
                                     (08/09/13)
Anatomy

 Cranium:          Appears normal         Aortic Arch:      Appears normal
 Fetal Cavum:      Appears normal         Ductal Arch:      Appears normal
 Ventricles:       Appears normal         Diaphragm:        Appears normal
 Choroid Plexus:   Appears normal         Stomach:          Appears normal, left
                                                            sided
 Cerebellum:       Appears normal         Abdomen:          Appears normal
 Posterior Fossa:  Appears normal         Abdominal Wall:   Appears nml (cord
                                                            insert, abd wall)
 Nuchal Fold:      Not applicable (>20    Cord Vessels:     Appears normal (3
                   wks GA)                                  vessel cord)
 Face:             Appears normal         Kidneys:          Appear normal
                   (orbits and profile)
 Lips:             Appears normal         Bladder:          Appears normal
 Heart:            Not well visualized    Spine:            Appears normal
 RVOT:             Not well visualized    Lower             Visualized
                                          Extremities:
 LVOT:             Not well visualized    Upper             Visualized
                                          Extremities:

 Other:  Fetus appears to be a male. Heels and 5th digit visualized. Nasal
         bone visualized. Technically difficult due to fetal position.
Cervix Uterus Adnexa

 Cervical Length:    3.8      cm

 Cervix:       Measured transvaginally. Appears closed, without
               funnelling.

 Adnexa:     No abnormality visualized.
Impression

 Single IUP at 28w 2d
 Hx of gestational thrombocytopenia, previous child with a
 complex fetal heart anomaly
 Normal fetal anatomic survey; however, limited views of the
 fetal heart were obtained due to fetal position
 Fetal growth is appropriate (56th %tile)
 Normal amniotic fluid volume

 TVUS - a posterior / right lateral placenta is noted.  A
 velamentous cord insertion is noted that courses along the
 lower uterine segment and internal os.   A funic presentation
 is noted as well as a vasa previa that course along the cervix.

 The findings and limitations of the study were discussed with
 the patient and I reviwed the need for inpatient observation
 and likely early delivery.
Recommendations

 Recommend admission for a course of Popin and
 fetal testing.
 Recommend follow up ultrasound with MFM next week - if the
 vasa previa is again appreciated, would recommend
 continued inpatient observation with a scheduled C-section at
 34 weeks.
 Will make arrangements with Peds cardiology for inpatient
 fetal echo due to history of a previous child with a complex
 heart anomaly
 Findings and recommendations discussed with Dr. Francisca.

 questions or concerns.

## 2016-01-21 IMAGING — US US MFM OB TRANSVAGINAL
1 series · 13 of 15 positions shown · non-contrast
Comparison: none

[Series 1: us mfm ob transvaginal · 0.23mm/px · 13 of 15 slices shown]
[im 1/15]
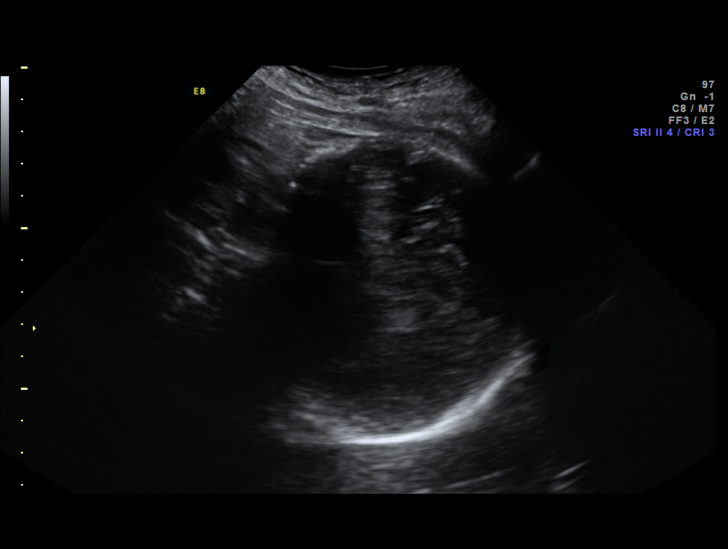
[im 2/15]
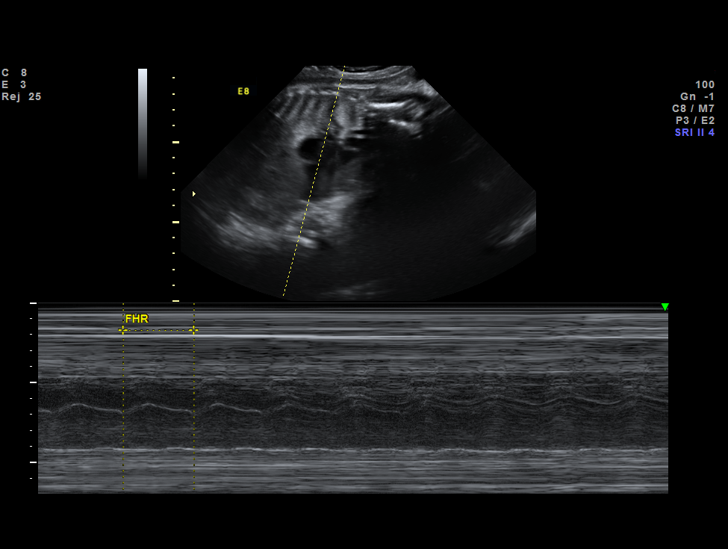
[im 3/15]
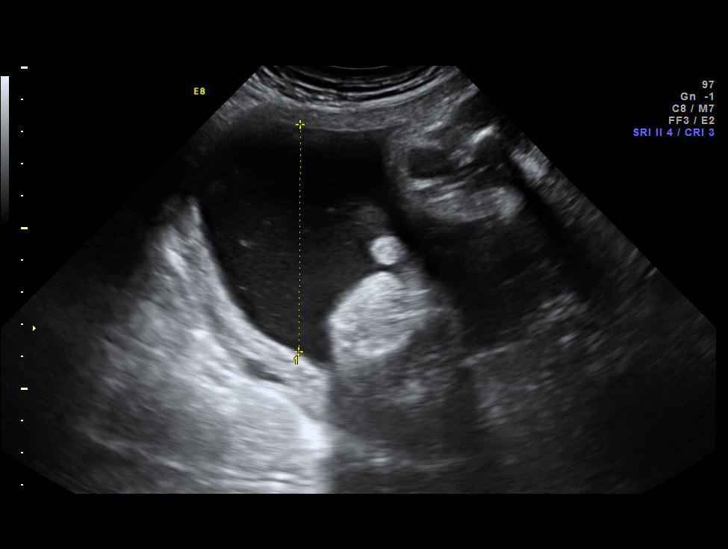
[im 5/15]
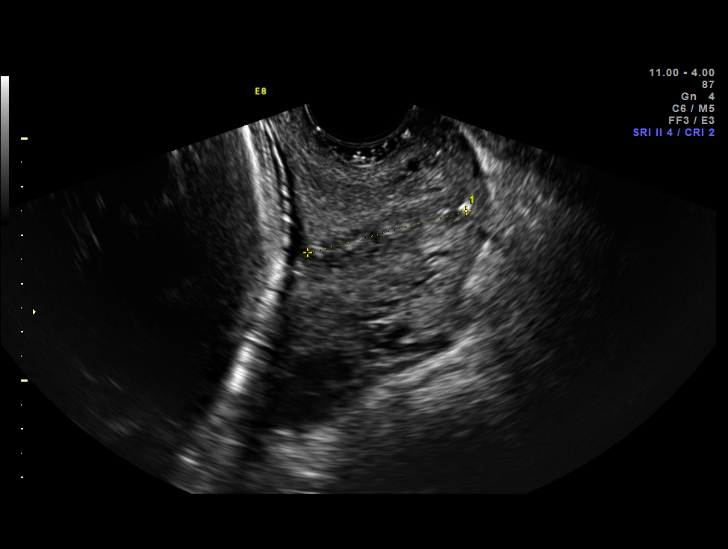
[im 6/15]
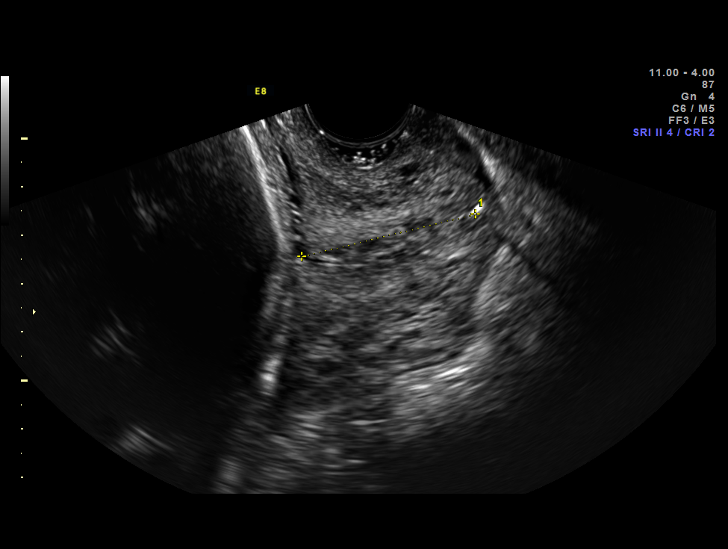
[im 7/15]
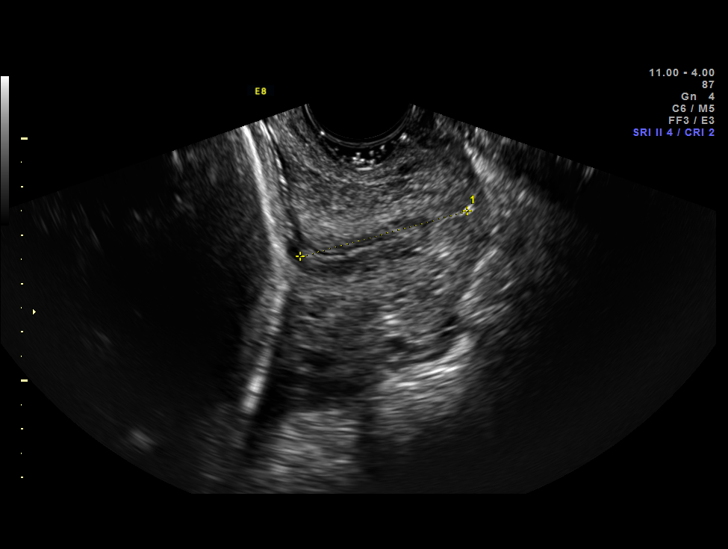
[im 8/15]
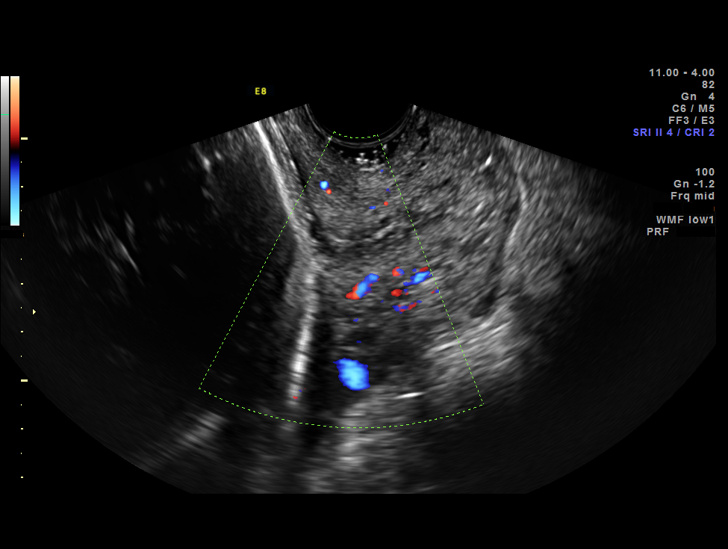
[im 9/15]
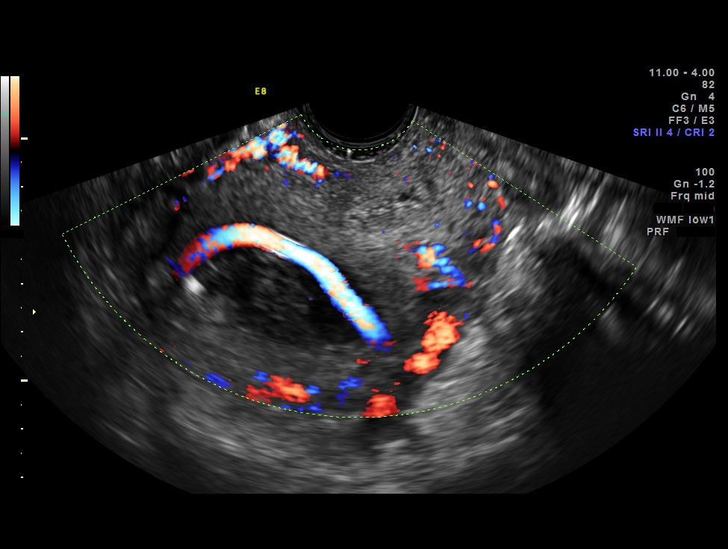
[im 10/15]
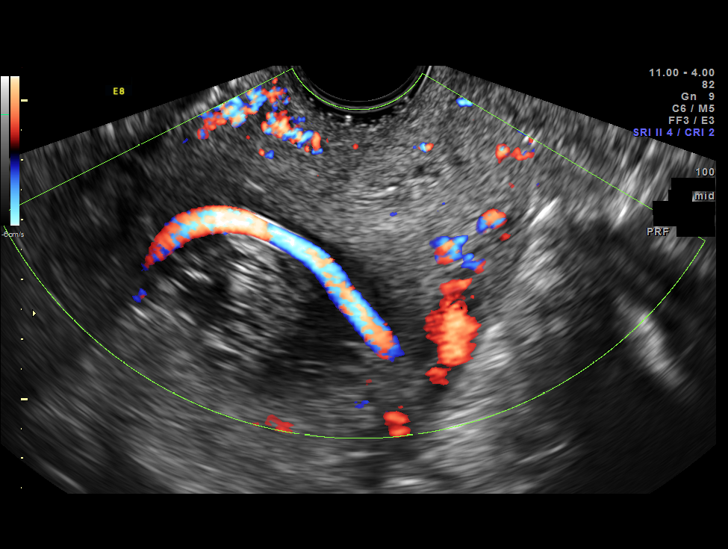
[im 11/15]
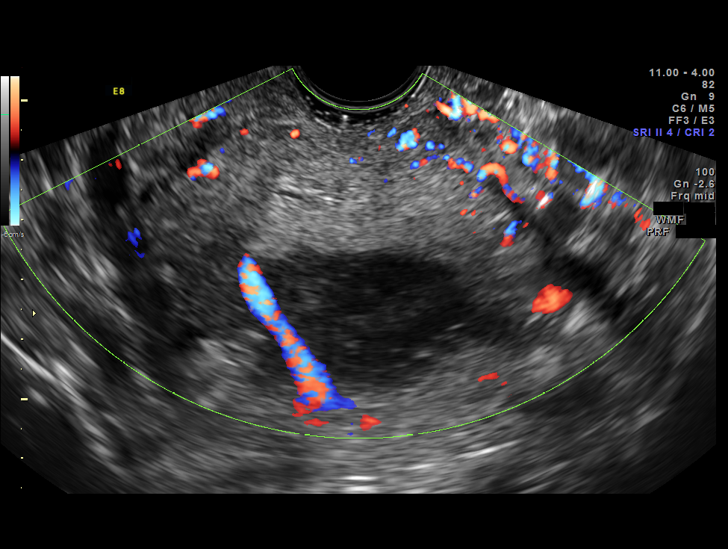
[im 13/15]
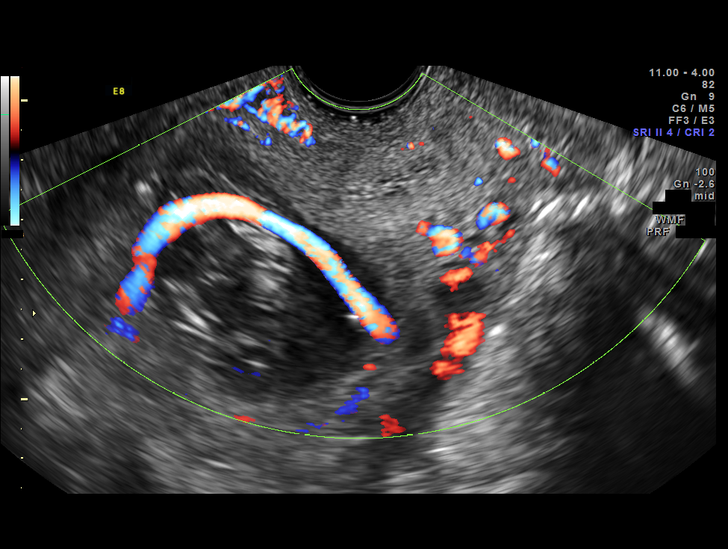
[im 14/15]
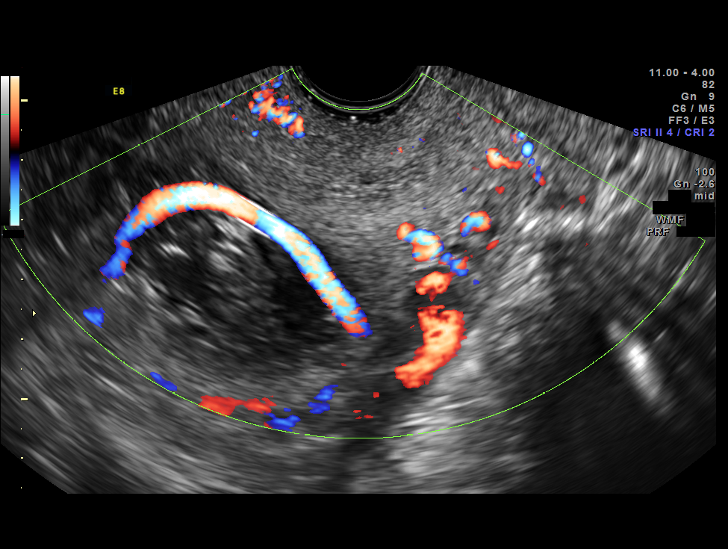
[im 15/15]
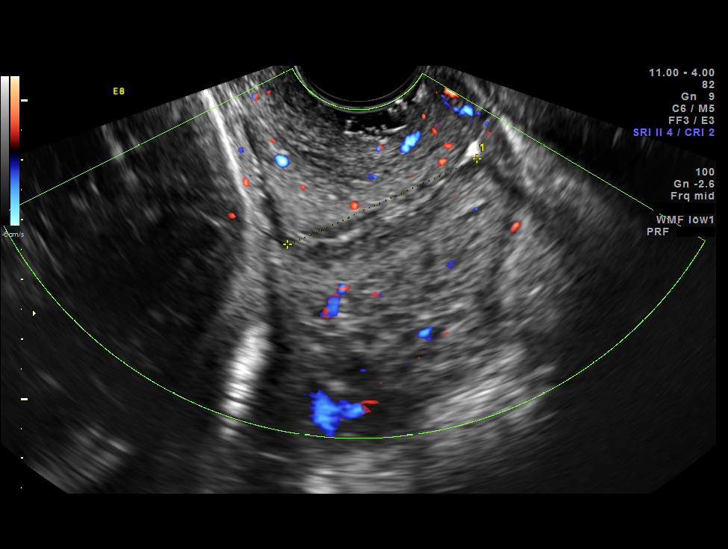

[13 of 15 positions shown; findings below may reference images not displayed]

OBSTETRICS REPORT
                      (Signed Final 02/01/2014 [DATE])

Service(s) Provided

 US MFM OB TRANSVAGINAL                                76817.2
Indications

 Vasa previa
 History of genetic / anatomic abnormality - CHD
 (pt's daughter)
 Thrombocytopenia, gestational
 History of cesarean delivery, currently pregnant
 33 weeks gestation of pregnancy
Fetal Evaluation

 Num Of Fetuses:    1
 Fetal Heart Rate:  142                          bpm
 Cardiac Activity:  Observed
 Presentation:      Cephalic

 Amniotic Fluid
 AFI FV:      Subjectively within normal limits
                                             Larg Pckt:    7.07  cm
Gestational Age

 LMP:           34w 5d        Date:  06/03/13                 EDD:   03/10/14
 Best:          33w 6d     Det. By:  Early Ultrasound         EDD:   03/16/14
                                     (08/09/13)
Cervix Uterus Adnexa

 Cervical Length:    3.6      cm

 Cervix:       Normal appearance by transvaginal scan
Impression

 SIUP at 33+6 weeks
 Vasa previa - currently admitted for inpatient observation; has
 completed 2 courses of Sauceda
 Cephalic presentation
 Normal amniotic fluid volume
 Cervix is long and closed

 TVUS - cervical length 3.6 cm.  A fetal arterial vessel is noted
 that courses along the cervix.
Recommendations

 Recommend delivery by cesarean section at 34-35 weeks (by
 35 weeks).  If patient labors in the interim, she should be
 delivered by cesarean immediately upon diagnosis (not
 delayed).

 questions or concerns.

## 2016-01-26 IMAGING — US US MFM OB TRANSVAGINAL
1 series · 13 of 20 positions shown · non-contrast
Comparison: none

[Series 1: us mfm ob transvaginal · 0.26mm/px · 13 of 20 slices shown]
[im 1/20]
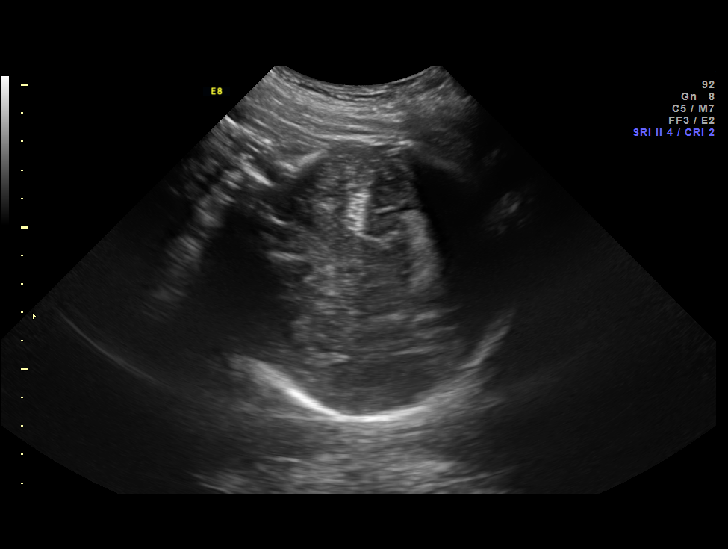
[im 3/20]
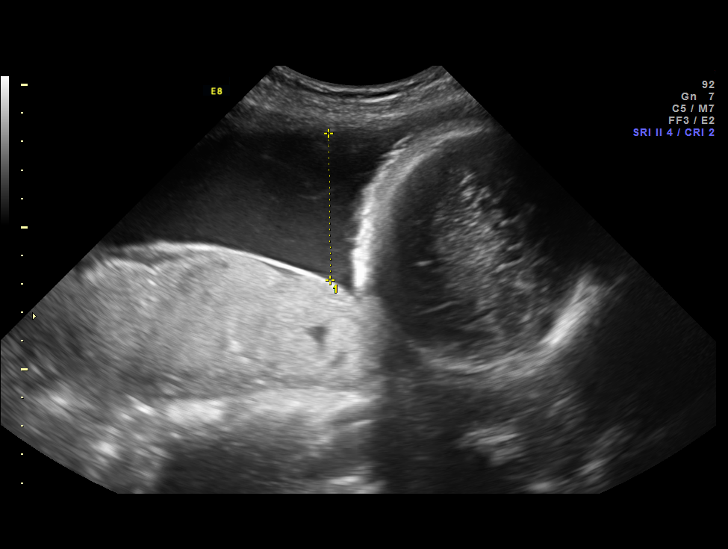
[im 4/20]
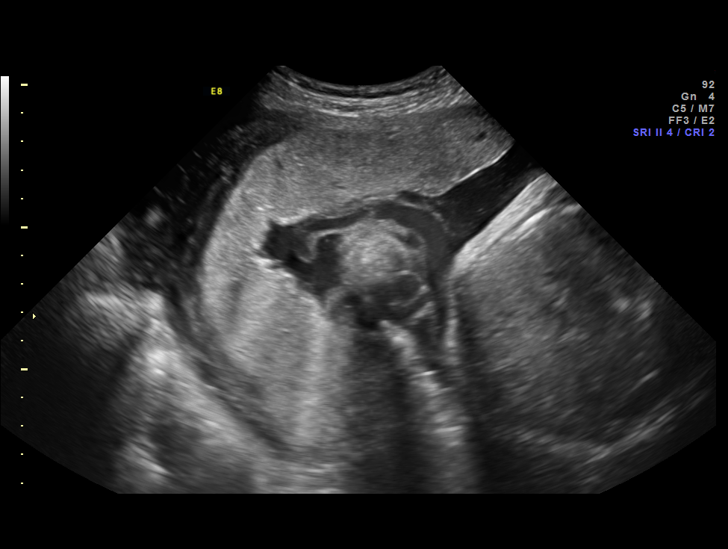
[im 6/20]
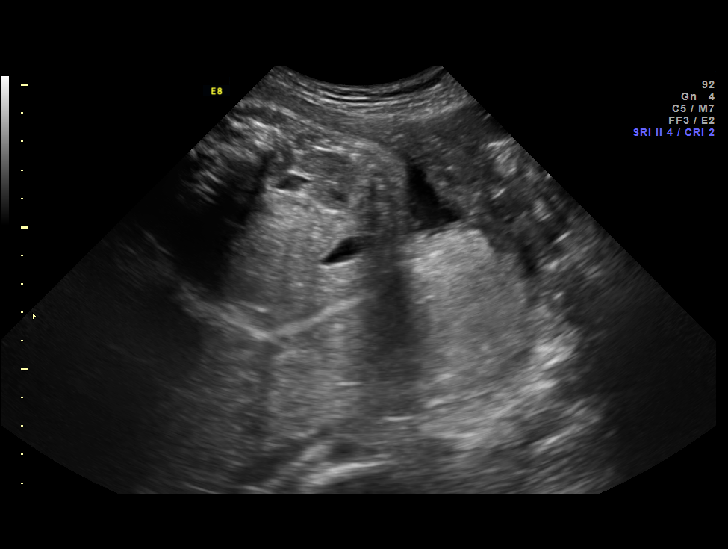
[im 7/20]
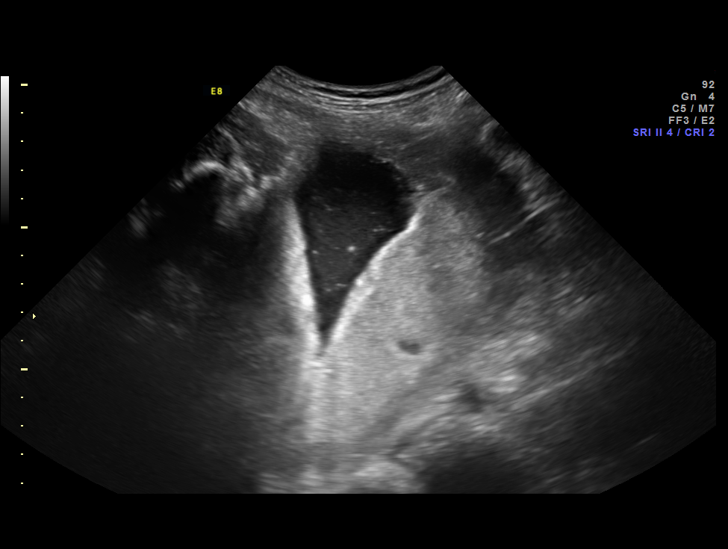
[im 9/20]
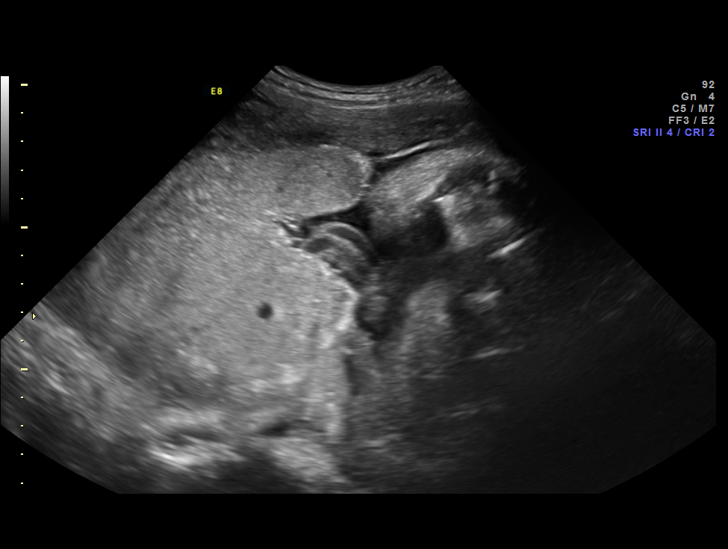
[im 11/20]
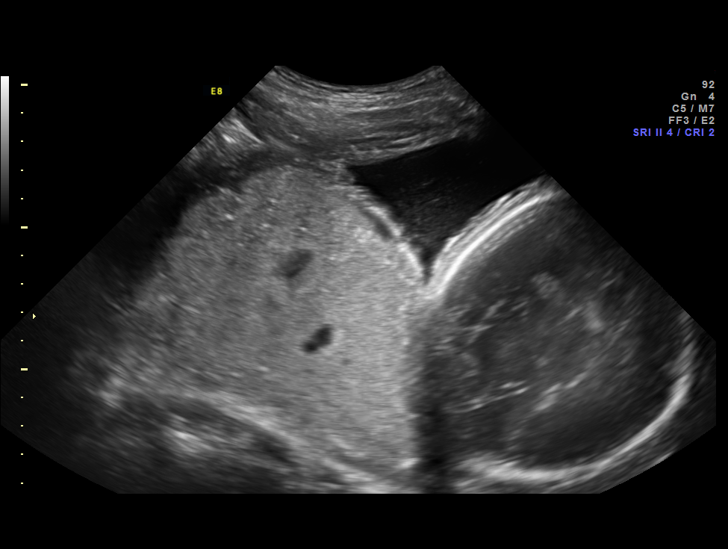
[im 12/20]
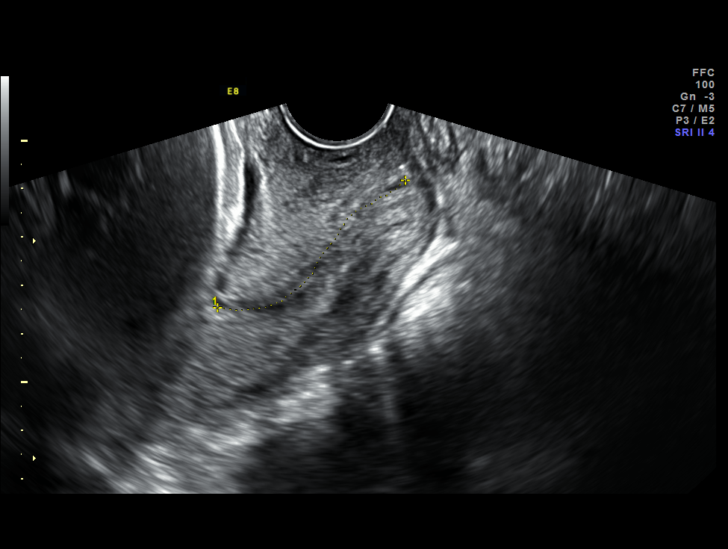
[im 14/20]
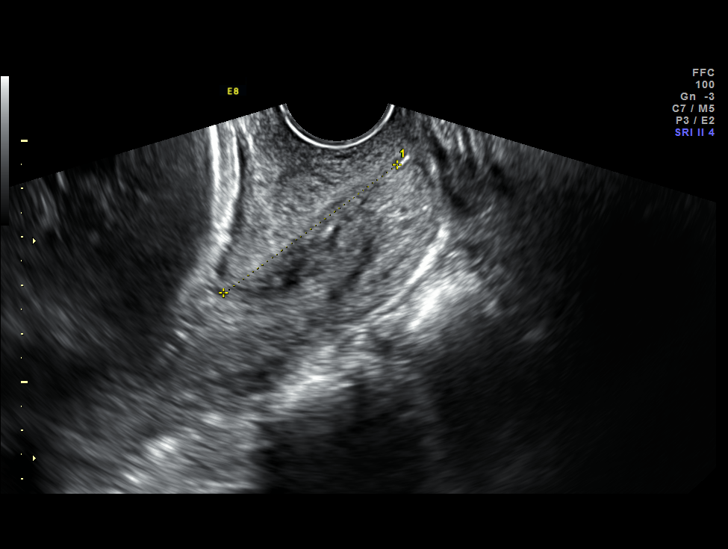
[im 15/20]
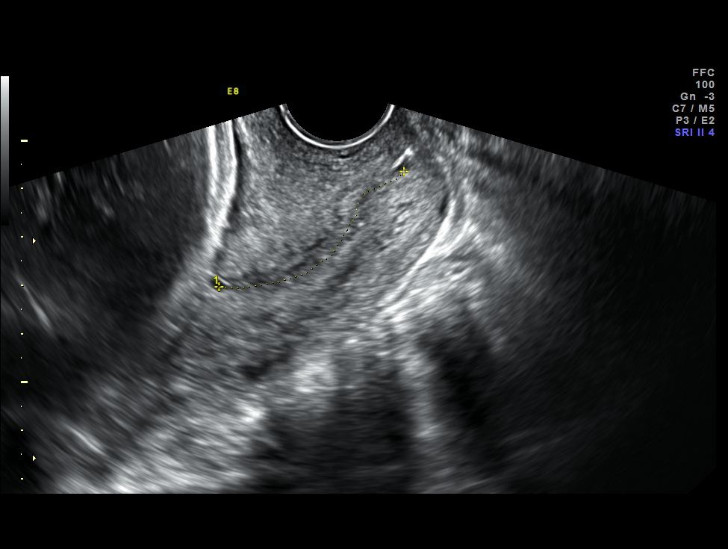
[im 17/20]
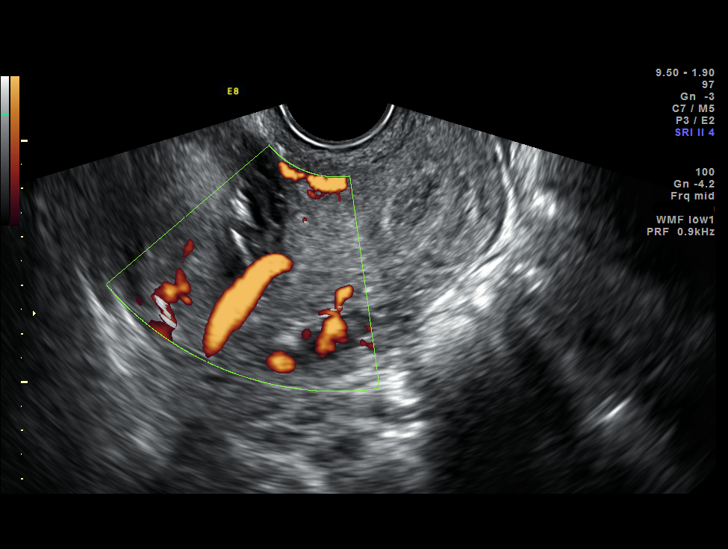
[im 18/20]
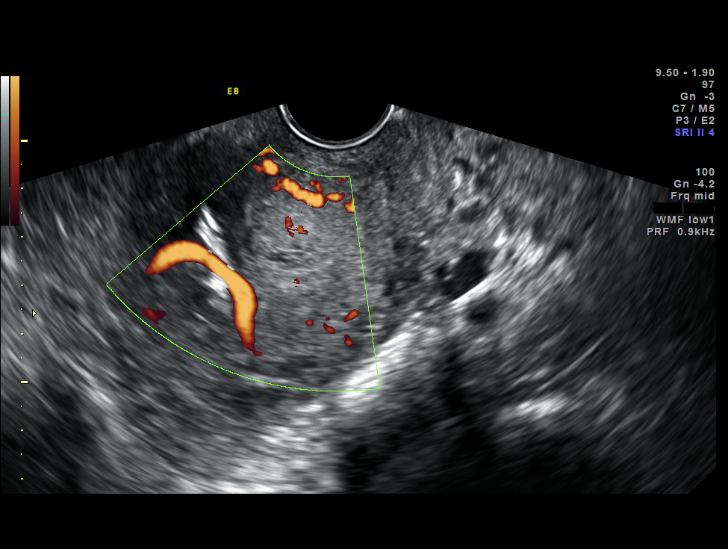
[im 20/20]
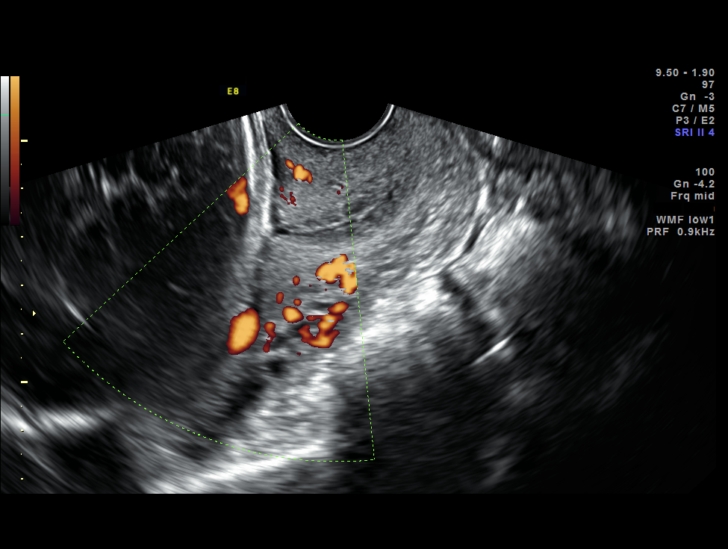

[13 of 20 positions shown; findings below may reference images not displayed]

OBSTETRICS REPORT
                      (Signed Final 02/06/2014 [DATE])

Service(s) Provided

 US MFM OB TRANSVAGINAL                                76817.2
Indications

 Vasa previa
 History of genetic / anatomic abnormality - CHD
 (pt's daughter)
 Thrombocytopenia, gestational
 History of cesarean delivery, currently pregnant
 Preterm labor (> 22 weeks)
 34 weeks gestation of pregnancy

Fetal Evaluation

 Num Of Fetuses:    1
 Fetal Heart Rate:  139                          bpm
 Cardiac Activity:  Observed
 Presentation:      Cephalic
 Placenta:          RT LAT LEFT POST
                    above int os

 Amniotic Fluid
 AFI FV:      Subjectively within normal limits
                                             Larg Pckt:     5.2  cm
Gestational Age

 LMP:           35w 3d        Date:  06/03/13                 EDD:   03/10/14
 Best:          34w 4d     Det. By:  Early Ultrasound         EDD:   03/16/14
                                     (08/09/13)
Cervix Uterus Adnexa

 Cervical Length:    4.6      cm

 Cervix:       Normal appearance by transvaginal scan
Impression

 SIUP at 34+4 weeks
 Normal amniotic fluid volume
 EV views of cervix: normal length without funneling; fetal
 vessel visualized but not fully evaluated
Recommendations

 Follow-up as clinically indicated

 questions or concerns.

## 2016-05-28 ENCOUNTER — Ambulatory Visit: Payer: Medicaid Other | Admitting: Obstetrics

## 2016-06-13 ENCOUNTER — Ambulatory Visit: Payer: Medicaid Other | Admitting: Obstetrics

## 2016-11-15 ENCOUNTER — Ambulatory Visit (HOSPITAL_COMMUNITY)
Admission: EM | Admit: 2016-11-15 | Discharge: 2016-11-15 | Disposition: A | Discharge status: Home / Self Care | Payer: Self-pay | Attending: Family Medicine | Admitting: Family Medicine

## 2016-11-15 ENCOUNTER — Encounter (HOSPITAL_COMMUNITY): Payer: Self-pay | Admitting: *Deleted

## 2016-11-15 DIAGNOSIS — G43109 Migraine with aura, not intractable, without status migrainosus: Secondary | ICD-10-CM

## 2016-11-15 MED ORDER — KETOROLAC TROMETHAMINE 60 MG/2ML IM SOLN
60.0000 mg | Freq: Once | INTRAMUSCULAR | Status: AC
  Administered 2016-11-15: 60 mg via INTRAMUSCULAR

## 2016-11-15 MED ORDER — KETOROLAC TROMETHAMINE 60 MG/2ML IM SOLN
INTRAMUSCULAR | Status: AC
  Filled 2016-11-15: qty 2

## 2016-11-15 NOTE — ED Triage Notes (Signed)
C/O HA progressively worsening over past week.  Has hx of HAs.  States this one feels worse than usual.  C/O blurred vision.  Denies n/v.  C/O photosensitivity.

## 2016-11-15 NOTE — ED Provider Notes (Signed)
  [facility]   [id number] 11/15/16 Arrival Time: 1407  ASSESSMENT & PLAN:  1. Migraine with aura and without status migrainosus, not intractable     Meds ordered this encounter  Medications  . ketorolac (TORADOL) injection 60 mg   Recommend f/u with PCP to inquire about referral to neurology. Toradol IM given. She may f/u here as needed. If worsening she will seek prompt evaluation. Reviewed expectations re: course of current medical issues. Questions answered. Outlined signs and symptoms indicating need for more acute intervention. Patient verbalized understanding. After Visit Summary given.   SUBJECTIVE:  [name] is a 36 y.o. female who presents with complaint of migraine headache. H/O migraines for years and reports frequent exacerbations. Was scheduled to see a neurologist but couldn't make appointment. Symptoms consistent with previous migraines. Aura of seeing spots. No n/v. Photophobia present. Describes as throbbing. Frontal but goes to sides of head. No specific aggravating or alleviating factors reported. OTC tylenol with mild help. Overall decreased PO intake secondary to headache. Normal ambulation. No recent illnesses. No triggers known.  ROS: As per HPI.   OBJECTIVE:  Vitals:   11/15/16 1526  BP: 111/73  Pulse: 70  Resp: 16  Temp: 99.4 F (37.4 C)  TempSrc: Oral  SpO2: 98%    General appearance: alert; no distress Eyes: PERRLA; EOMI; conjunctiva normal; reports light sensitivity HENT: normocephalic; atraumatic; TMs normal; nasal mucosa normal; oral mucosa normal Neck: supple Lungs: clear to auscultation bilaterally Heart: regular rate and rhythm Extremities: no cyanosis or edema; symmetrical with no gross deformities Skin: warm and dry Neurologic: normal gait; normal symmetric reflexes; normal extremity strength and sensation throughout Psychological: alert and cooperative; normal mood and affect   Allergies  Allergen Reactions  .  Shrimp [Shellfish Allergy] Anaphylaxis  . Iodinated Diagnostic Agents     Unknown    Past Medical History:  Diagnosis Date  . Allergy   . Anemia   . Asthma   . GERD (gastroesophageal reflux disease)   . Headache(784.0)   . Heart murmur    Social History   Social History  . Marital status: Married    Spouse name: N/A  . Number of children: N/A  . Years of education: N/A   Occupational History  . Not on file.   Social History Main Topics  . Smoking status: Never Smoker  . Smokeless tobacco: Never Used  . Alcohol use No  . Drug use: No  . Sexual activity: Not on file   Other Topics Concern  . Not on file   Social History Narrative  . No narrative on file   Family History  Problem Relation Age of Onset  . Diabetes Other   . Cancer Other   . Cancer Mother   . Heart defect Daughter   . Heart disease Maternal Grandmother   . Arthritis Maternal Grandmother   . Cancer Maternal Grandmother   . Diabetes Maternal Grandmother   . Hyperlipidemia Maternal Grandmother   . Depression Sister   . COPD Maternal Grandfather   . Arthritis Paternal Grandmother    Past Surgical History:  Procedure Laterality Date  . CESAREAN SECTION    . CESAREAN SECTION N/A 02/08/2014   Procedure: REPEAT  CESAREAN SECTION;  Surgeon: Antionette CharLisa Jackson-Moore, MD;  Location: [facility];  Service: Obstetrics;  Laterality: N/AMardella Layman;     Coco Sharpnack, MD 11/15/16 [phone]

## 2016-12-19 ENCOUNTER — Ambulatory Visit: Payer: Self-pay | Admitting: Obstetrics

## 2017-01-22 ENCOUNTER — Encounter: Payer: Self-pay | Admitting: Obstetrics

## 2017-01-22 ENCOUNTER — Ambulatory Visit (INDEPENDENT_AMBULATORY_CARE_PROVIDER_SITE_OTHER): Payer: Medicaid Other | Admitting: Obstetrics

## 2017-01-22 ENCOUNTER — Other Ambulatory Visit (HOSPITAL_COMMUNITY)
Admission: RE | Admit: 2017-01-22 | Discharge: 2017-01-22 | Disposition: A | Discharge status: Home / Self Care | Payer: Medicaid Other | Source: Ambulatory Visit | Attending: Obstetrics | Admitting: Obstetrics

## 2017-01-22 VITALS — BP 129/83 | HR 66 | Ht 68.0 in | Wt 148.8 lb

## 2017-01-22 DIAGNOSIS — Z113 Encounter for screening for infections with a predominantly sexual mode of transmission: Secondary | ICD-10-CM

## 2017-01-22 DIAGNOSIS — Z01419 Encounter for gynecological examination (general) (routine) without abnormal findings: Secondary | ICD-10-CM | POA: Insufficient documentation

## 2017-01-22 DIAGNOSIS — Z309 Encounter for contraceptive management, unspecified: Secondary | ICD-10-CM

## 2017-01-22 DIAGNOSIS — N898 Other specified noninflammatory disorders of vagina: Secondary | ICD-10-CM

## 2017-01-22 DIAGNOSIS — Z3046 Encounter for surveillance of implantable subdermal contraceptive: Secondary | ICD-10-CM

## 2017-01-22 NOTE — Progress Notes (Signed)
Subjective:        [name] is a 36 y.o. female here for a routine exam.  Current complaints: None.    Personal health questionnaire:  Is patient [name], have a family history of breast and/or ovarian cancer: no Is there a family history of uterine cancer diagnosed at age < 4050, gastrointestinal cancer, urinary tract cancer, family member who is a Personnel officerLynch syndrome-associated carrier: no Is the patient overweight and hypertensive, family history of diabetes, personal history of gestational diabetes, preeclampsia or PCOS: no Is patient over 8055, have PCOS,  family history of premature CHD under age 34, diabetes, smoke, have hypertension or peripheral artery disease:  no At any time, has a partner hit, kicked or otherwise hurt or frightened you?: no Over the past 2 weeks, have you felt down, depressed or hopeless?: no Over the past 2 weeks, have you felt little interest or pleasure in doing things?:no   Gynecologic History No LMP recorded. Patient has had an implant. Contraception: Nexplanon Last Pap: 2016. Results were: normal Last mammogram: n/a. Results were: n/a  Obstetric History OB History  Gravida Para Term Preterm AB Living  4 3 2 1 1 3   SAB TAB Ectopic Multiple Live Births    1   0 3    # Outcome Date GA Lbr Len/2nd Weight Sex Delivery Anes PTL Lv  4 Preterm 02/08/14 [id number]  5 lb 12.8 oz (2.63 kg) M CS-LTranv Spinal  LIV  3 Term 02/27/05    M CS-LTranv   LIV  2 Term 11/16/01    F CS-LTranv   LIV  1 TAB               Past Medical History:  Diagnosis Date  . Allergy   . Anemia   . Asthma   . GERD (gastroesophageal reflux disease)   . Headache(784.0)   . Heart murmur     Past Surgical History:  Procedure Laterality Date  . CESAREAN SECTION  02/27/2005  . CESAREAN SECTION N/A 02/08/2014   Procedure: REPEAT  CESAREAN SECTION;  Surgeon: Antionette CharLisa Jackson-Moore, MD;  Location: [facility];  Service: Obstetrics;  Laterality: N/A;  . CESAREAN SECTION  11/16/2001      Current Outpatient Medications:  .  albuterol (PROVENTIL HFA;VENTOLIN HFA) 108 (90 BASE) MCG/ACT inhaler, Inhale 2 puffs into the lungs every 6 (six) hours as needed for wheezing or shortness of breath. Asthma, Disp: , Rfl:  .  ELIDEL 1 % cream, apply to affected area twice a day, Disp: 100 g, Rfl: 1 .  ELIDEL 1 % cream, apply to affected area twice a day, Disp: 60 g, Rfl: 1 Allergies  Allergen Reactions  . Shrimp [Shellfish Allergy] Anaphylaxis  . Iodinated Diagnostic Agents     Unknown    Social History   Tobacco Use  . Smoking status: Never Smoker  . Smokeless tobacco: Never Used  Substance Use Topics  . Alcohol use: No    Alcohol/week: 0.0 oz    Family History  Problem Relation Age of Onset  . Diabetes Other   . Cancer Other   . Ovarian cancer Mother   . Lung cancer Mother   . Heart defect Daughter   . Heart disease Maternal Grandmother   . Arthritis Maternal Grandmother   . Cancer Maternal Grandmother   . Diabetes Maternal Grandmother   . Hyperlipidemia Maternal Grandmother   . Depression Sister   . COPD Maternal Grandfather   . Arthritis Paternal Grandmother  Review of Systems  Constitutional: negative for fatigue and weight loss Respiratory: negative for cough and wheezing Cardiovascular: negative for chest pain, fatigue and palpitations Gastrointestinal: negative for abdominal pain and change in bowel habits Musculoskeletal:negative for myalgias Neurological: negative for gait problems and tremors Behavioral/Psych: negative for abusive relationship, depression Endocrine: negative for temperature intolerance    Genitourinary:negative for abnormal menstrual periods, genital lesions, hot flashes, sexual problems and vaginal discharge Integument/breast: negative for breast lump, breast tenderness, nipple discharge and skin lesion(s)    Objective:       BP 129/83   Pulse 66   Ht 5\' 8"  (1.727 m)   Wt 148 lb 12.8 oz (67.5 kg)   BMI 22.62 kg/m   General:   alert  Skin:   no rash or abnormalities  Lungs:   clear to auscultation bilaterally  Heart:   regular rate and rhythm, S1, S2 normal, no murmur, click, rub or gallop  Breasts:   normal without suspicious masses, skin or nipple changes or axillary nodes  Abdomen:  normal findings: no organomegaly, soft, non-tender and no hernia  Pelvis:  External genitalia: normal general appearance Urinary system: urethral meatus normal and bladder without fullness, nontender Vaginal: normal without tenderness, induration or masses Cervix: normal appearance Adnexa: normal bimanual exam Uterus: anteverted and non-tender, normal size   Lab Review Urine pregnancy test Labs reviewed yes Radiologic studies reviewed no  50% of 20 min visit spent on counseling and coordination of care.    Assessment:     1. Encounter for gynecological examination with Papanicolaou smear of cervix Rx: - Cytology - PAP  2. Vaginal discharge Rx: - Cervicovaginal ancillary only  3. Screening examination for STD (sexually transmitted disease) Rx: - HIV antibody - RPR  4. Encounter for surveillance of implantable subdermal contraceptive - pleased with Nexplanon.  Expires 12-25-2017.   Plan:    Education reviewed: calcium supplements, depression evaluation, low fat, low cholesterol diet, safe sex/STD prevention, self breast exams and weight bearing exercise. Contraception: Nexplanon. Follow up in: 1 year.   No orders of the defined types were placed in this encounter.  Orders Placed This Encounter  Procedures  . HIV antibody  . RPR    Brock BadHARLES A. Arpita Fentress MD

## 2017-01-23 LAB — CERVICOVAGINAL ANCILLARY ONLY
Bacterial vaginitis: NEGATIVE
Candida vaginitis: NEGATIVE
Chlamydia: NEGATIVE
NEISSERIA GONORRHEA: NEGATIVE
TRICH (WINDOWPATH): NEGATIVE

## 2017-01-23 LAB — RPR: RPR: NONREACTIVE

## 2017-01-23 LAB — HIV ANTIBODY (ROUTINE TESTING W REFLEX): HIV SCREEN 4TH GENERATION: NONREACTIVE

## 2017-01-26 LAB — CYTOLOGY - PAP
DIAGNOSIS: NEGATIVE
HPV: NOT DETECTED

## 2017-02-02 ENCOUNTER — Ambulatory Visit: Payer: Self-pay | Admitting: Obstetrics

## 2017-08-17 ENCOUNTER — Emergency Department (HOSPITAL_COMMUNITY)
Admission: EM | Admit: 2017-08-17 | Discharge: 2017-08-17 | Disposition: A | Discharge status: Home / Self Care | Payer: Medicaid Other | Attending: Emergency Medicine | Admitting: Emergency Medicine

## 2017-08-17 ENCOUNTER — Encounter (HOSPITAL_COMMUNITY): Payer: Self-pay | Admitting: Emergency Medicine

## 2017-08-17 DIAGNOSIS — J45909 Unspecified asthma, uncomplicated: Secondary | ICD-10-CM | POA: Insufficient documentation

## 2017-08-17 DIAGNOSIS — Z79899 Other long term (current) drug therapy: Secondary | ICD-10-CM | POA: Insufficient documentation

## 2017-08-17 DIAGNOSIS — G43909 Migraine, unspecified, not intractable, without status migrainosus: Secondary | ICD-10-CM | POA: Insufficient documentation

## 2017-08-17 MED ORDER — KETOROLAC TROMETHAMINE 30 MG/ML IJ SOLN
30.0000 mg | Freq: Once | INTRAMUSCULAR | Status: AC
  Administered 2017-08-17: 30 mg via INTRAMUSCULAR
  Filled 2017-08-17: qty 1

## 2017-08-17 MED ORDER — METOCLOPRAMIDE HCL 10 MG PO TABS
5.0000 mg | ORAL_TABLET | Freq: Once | ORAL | Status: AC
  Administered 2017-08-17: 5 mg via ORAL
  Filled 2017-08-17: qty 1

## 2017-08-17 MED ORDER — DIPHENHYDRAMINE HCL 25 MG PO CAPS
25.0000 mg | ORAL_CAPSULE | Freq: Once | ORAL | Status: AC
  Administered 2017-08-17: 25 mg via ORAL
  Filled 2017-08-17: qty 1

## 2017-08-17 NOTE — ED Provider Notes (Signed)
[facility] EMERGENCY DEPARTMENT Provider Note   CSN: [id number] Arrival date & time: 08/17/17  1051   History   Chief Complaint Chief Complaint  Patient presents with  . Migraine    HPI [name] is a 37 y.o. female.  HPI   37 year old female presents today with complaints of migraine.  Patient notes a significant past medical history the same.  Patient notes last night she started to have a throbbing sensation in the right frontal head.  She denies any fever, trauma, neck stiffness, any neurological deficits, or any other red flags.  She does note some soreness in the left lateral trapezius and lateral musculature of the cervical region.  Patient has not tried any medications for this.  This has slowly progressed.  Past Medical History:  Diagnosis Date  . Allergy   . Anemia   . Asthma   . GERD (gastroesophageal reflux disease)   . Headache(784.0)   . Heart murmur     Patient Active Problem List   Diagnosis Date Noted  . S/P cesarean section 02/08/2014  . [redacted] weeks gestation of pregnancy   . Irregular contractions   . Hereditary disease in family possibly affecting fetus, affecting management of mother, antepartum condition or complication   . [redacted] weeks gestation of pregnancy   . Previous cesarean delivery affecting pregnancy   . [redacted] weeks gestation of pregnancy   . Poor fetal growth affecting management of mother in third trimester, antepartum   . Anemia during pregnancy 01/14/2014  . Abdominal pain affecting pregnancy, antepartum   . Non-reactive NST (non-stress test)   . [redacted] weeks gestation of pregnancy   . Vasa previa 12/16/2013  . Abnormal EKG 09/05/2013  . Hyperemesis complicating pregnancy, antepartum 09/05/2013  . Nonspecific abnormal electrocardiogram (ECG) (EKG) 08/13/2013  . Nausea and vomiting in pregnancy prior to [redacted] weeks gestation 08/13/2013    Past Surgical History:  Procedure Laterality Date  . CESAREAN SECTION  02/27/2005  .  CESAREAN SECTION N/A 02/08/2014   Procedure: REPEAT  CESAREAN SECTION;  Surgeon: Antionette Char, MD;  Location: [facility];  Service: Obstetrics;  Laterality: N/A;  . CESAREAN SECTION  11/16/2001     OB History    Gravida  4   Para  3   Term  2   Preterm  1   AB  1   Living  3     SAB      TAB  1   Ectopic      Multiple  0   Live Births  3            Home Medications    Prior to Admission medications   Medication Sig Start Date End Date Taking? Authorizing Provider  albuterol (PROVENTIL HFA;VENTOLIN HFA) 108 (90 BASE) MCG/ACT inhaler Inhale 2 puffs into the lungs every 6 (six) hours as needed for wheezing or shortness of breath. Asthma    [provider]  ELIDEL 1 % cream apply to affected area twice a day 11/13/15   Orvilla Cornwall A, CNM  ELIDEL 1 % cream apply to affected area twice a day 09/05/15   [name], MD  norethindrone (ORTHO MICRONOR) 0.35 MG tablet Take 1 tablet (0.35 mg total) by mouth daily. 02/11/14 07/24/14  Antionette Char, MD    Family History Family History  Problem Relation Age of Onset  . Diabetes Other   . Cancer Other   . Ovarian cancer Mother   . Lung cancer Mother   .  Heart defect Daughter   . Heart disease Maternal Grandmother   . Arthritis Maternal Grandmother   . Cancer Maternal Grandmother   . Diabetes Maternal Grandmother   . Hyperlipidemia Maternal Grandmother   . Depression Sister   . COPD Maternal Grandfather   . Arthritis Paternal Grandmother     Social History Social History   Tobacco Use  . Smoking status: Never Smoker  . Smokeless tobacco: Never Used  Substance Use Topics  . Alcohol use: No    Alcohol/week: 0.0 oz  . Drug use: No    Allergies   Shrimp [shellfish allergy] and Iodinated diagnostic agents   Review of Systems Review of Systems  All other systems reviewed and are negative.  Physical Exam Updated Vital Signs BP 119/87 (BP Location: Right Arm)   Pulse 69   Temp 99 F  (37.2 C) (Oral)   Resp 16   SpO2 100%   Physical Exam  Constitutional: She is oriented to person, place, and time. She appears well-developed and well-nourished.  HENT:  Head: Normocephalic and atraumatic.  Eyes: Pupils are equal, round, and reactive to light. Conjunctivae are normal. Right eye exhibits no discharge. Left eye exhibits no discharge. No scleral icterus.  Neck: Normal range of motion. No JVD present. No tracheal deviation present.  Pulmonary/Chest: Effort normal. No stridor.  Neurological: She is alert and oriented to person, place, and time. She has normal strength. No cranial nerve deficit or sensory deficit. Coordination normal. GCS eye subscore is 4. GCS verbal subscore is 5. GCS motor subscore is 6.  Psychiatric: She has a normal mood and affect. Her behavior is normal. Judgment and thought content normal.  Nursing note and vitals reviewed.   ED Treatments / Results  Labs (all labs ordered are listed, but only abnormal results are displayed) Labs Reviewed - No data to display  EKG None  Radiology No results found.  Procedures Procedures (including critical care time)  Medications Ordered in ED Medications  ketorolac (TORADOL) 30 MG/ML injection 30 mg (30 mg Intramuscular Given 08/17/17 1321)  metoCLOPramide (REGLAN) tablet 5 mg (5 mg Oral Given 08/17/17 1320)  diphenhydrAMINE (BENADRYL) capsule 25 mg (25 mg Oral Given 08/17/17 1320)     Initial Impression / Assessment and Plan / ED Course  I have reviewed the triage vital signs and the nursing notes.  Pertinent labs & imaging results that were available during my care of the patient were reviewed by me and considered in my medical decision making (see chart for details).     Labs:   Imaging:  Consults:  Therapeutics: Toradol, Reglan, Benadryl  Discharge Meds:   Assessment/Plan: 89 YOF presents today with complaints of migraine.  Symptoms improved with medication, no red flags, no need for  further evaluation.  Discharged with outpatient follow-up and strict return precautions.  Patient verbalized understanding and agreement to today's plan had no further questions or concerns.   Final Clinical Impressions(s) / ED Diagnoses   Final diagnoses:  Migraine without status migrainosus, not intractable, unspecified migraine type    ED Discharge Orders    None       Eyvonne MechanicHedges, Krystol Rocco, PA-C 08/17/17 1446    Rolan BuccoBelfi, Melanie, MD 08/17/17 1746

## 2017-08-17 NOTE — Discharge Instructions (Addendum)
Please read attached information. If you experience any new or worsening signs or symptoms please return to the emergency room for evaluation. Please follow-up with your primary care provider or specialist as discussed. Please use medication prescribed only as directed and discontinue taking if you have any concerning signs or symptoms.   °

## 2017-08-17 NOTE — ED Triage Notes (Signed)
Pt reports having a headache that started yesterday afternoon, pt reports history of migraines and states feels the same. Pt reports this happen before where she was seeing spots in her vision and had to have migraine cocktail. Pt has taken motrin with no relief.

## 2018-01-19 ENCOUNTER — Ambulatory Visit: Payer: Medicaid Other | Admitting: Obstetrics

## 2018-02-02 ENCOUNTER — Ambulatory Visit: Payer: Medicaid Other | Admitting: Obstetrics

## 2018-02-09 ENCOUNTER — Encounter (HOSPITAL_COMMUNITY): Payer: Self-pay | Admitting: Emergency Medicine

## 2018-02-09 ENCOUNTER — Emergency Department (HOSPITAL_COMMUNITY)
Admission: EM | Admit: 2018-02-09 | Discharge: 2018-02-09 | Disposition: A | Discharge status: Home / Self Care | Payer: Medicaid Other | Attending: Emergency Medicine | Admitting: Emergency Medicine

## 2018-02-09 ENCOUNTER — Other Ambulatory Visit: Payer: Self-pay

## 2018-02-09 DIAGNOSIS — R51 Headache: Secondary | ICD-10-CM

## 2018-02-09 DIAGNOSIS — J4521 Mild intermittent asthma with (acute) exacerbation: Secondary | ICD-10-CM | POA: Insufficient documentation

## 2018-02-09 DIAGNOSIS — R519 Headache, unspecified: Secondary | ICD-10-CM

## 2018-02-09 MED ORDER — IPRATROPIUM-ALBUTEROL 0.5-2.5 (3) MG/3ML IN SOLN
3.0000 mL | Freq: Once | RESPIRATORY_TRACT | Status: AC
  Administered 2018-02-09: 3 mL via RESPIRATORY_TRACT
  Filled 2018-02-09: qty 3

## 2018-02-09 MED ORDER — KETOROLAC TROMETHAMINE 30 MG/ML IJ SOLN
30.0000 mg | Freq: Once | INTRAMUSCULAR | Status: AC
  Administered 2018-02-09: 30 mg via INTRAMUSCULAR
  Filled 2018-02-09: qty 1

## 2018-02-09 MED ORDER — ALBUTEROL SULFATE HFA 108 (90 BASE) MCG/ACT IN AERS: 1.0000 | INHALATION_SPRAY | Freq: Four times a day (QID) | RESPIRATORY_TRACT | 0 refills | Status: AC | PRN

## 2018-02-09 NOTE — ED Notes (Signed)
Discharge instructions and prescription discussed with Pt. Pt verbalized understanding. Pt stable and ambulatory.    

## 2018-02-09 NOTE — ED Triage Notes (Signed)
Pt c/o migraine x 3 days, SOB from "asthma flare up" 3 days ago, reports she has used her inhaler multiple times but it hasn't gotten rid of the tightness and SOB.

## 2018-02-09 NOTE — Discharge Instructions (Addendum)
Please read attached information. If you experience any new or worsening signs or symptoms please return to the emergency room for evaluation. Please follow-up with your primary care provider or specialist as discussed. Please use medication prescribed only as directed and discontinue taking if you have any concerning signs or symptoms.   °

## 2018-02-09 NOTE — ED Provider Notes (Signed)
[facility] EMERGENCY DEPARTMENT Provider Note   CSN: [id number] Arrival date & time: 02/09/18  1136     History   Chief Complaint Chief Complaint  Patient presents with  . Asthma  . Migraine    HPI [name] is a 37 y.o. female.  HPI   37 year old female presents today with complaints of migraine and asthma exacerbation.  Patient notes a history of migraines occasionally getting throbbing sensation left side of her face.  She notes again the symptoms started again 3 days ago similar to previous.  She denies any associated neurological deficits or known triggers.  No neck stiffness, trauma or fever.  Patient notes this is typical of previous.  Patient not taking any medication for this today.  Patient also noting 3 days of wheezing typical of asthma exacerbation.  She notes using her inhaler at home with only minor improvement in her symptoms.  She denies any fever or productive cough, notes this is typical of her asthma exacerbations.    Past Medical History:  Diagnosis Date  . Allergy   . Anemia   . Asthma   . GERD (gastroesophageal reflux disease)   . Headache(784.0)   . Heart murmur     Patient Active Problem List   Diagnosis Date Noted  . S/P cesarean section 02/08/2014  . [redacted] weeks gestation of pregnancy   . Irregular contractions   . Hereditary disease in family possibly affecting fetus, affecting management of mother, antepartum condition or complication   . [redacted] weeks gestation of pregnancy   . Previous cesarean delivery affecting pregnancy   . [redacted] weeks gestation of pregnancy   . Poor fetal growth affecting management of mother in third trimester, antepartum   . Anemia during pregnancy 01/14/2014  . Abdominal pain affecting pregnancy, antepartum   . Non-reactive NST (non-stress test)   . [redacted] weeks gestation of pregnancy   . Vasa previa 12/16/2013  . Abnormal EKG 09/05/2013  . Hyperemesis complicating pregnancy, antepartum 09/05/2013  .  Nonspecific abnormal electrocardiogram (ECG) (EKG) 08/13/2013  . Nausea and vomiting in pregnancy prior to [redacted] weeks gestation 08/13/2013    Past Surgical History:  Procedure Laterality Date  . CESAREAN SECTION  02/27/2005  . CESAREAN SECTION N/A 02/08/2014   Procedure: REPEAT  CESAREAN SECTION;  Surgeon: Antionette CharLisa Jackson-Moore, MD;  Location: [facility];  Service: Obstetrics;  Laterality: N/A;  . CESAREAN SECTION  11/16/2001     OB History    Gravida  4   Para  3   Term  2   Preterm  1   AB  1   Living  3     SAB      TAB  1   Ectopic      Multiple  0   Live Births  3            Home Medications    Prior to Admission medications   Medication Sig Start Date End Date Taking? Authorizing Provider  albuterol (PROVENTIL HFA;VENTOLIN HFA) 108 (90 Base) MCG/ACT inhaler Inhale 1-2 puffs into the lungs every 6 (six) hours as needed for wheezing or shortness of breath. 02/09/18   Ermelinda Eckert, Tinnie GensJeffrey, PA-C  ELIDEL 1 % cream apply to affected area twice a day 11/13/15   Orvilla Cornwallenney, Rachelle A, CNM  ELIDEL 1 % cream apply to affected area twice a day 09/05/15   Brock BadHarper, Charles A, MD    Family History Family History  Problem Relation Age of Onset  .  Diabetes Other   . Cancer Other   . Ovarian cancer Mother   . Lung cancer Mother   . Heart defect Daughter   . Heart disease Maternal Grandmother   . Arthritis Maternal Grandmother   . Cancer Maternal Grandmother   . Diabetes Maternal Grandmother   . Hyperlipidemia Maternal Grandmother   . Depression Sister   . COPD Maternal Grandfather   . Arthritis Paternal Grandmother     Social History Social History   Tobacco Use  . Smoking status: Never Smoker  . Smokeless tobacco: Never Used  Substance Use Topics  . Alcohol use: No    Alcohol/week: 0.0 standard drinks  . Drug use: No     Allergies   Shrimp [shellfish allergy] and Iodinated diagnostic agents   Review of Systems Review of Systems  All other systems reviewed and  are negative.    Physical Exam Updated Vital Signs BP 100/80   Pulse (!) 50   Temp 98.6 F (37 C) (Oral)   Resp 18   Ht 5\' 8"  (1.727 m)   Wt 63.5 kg   LMP 02/09/2018   SpO2 100%   BMI 21.29 kg/m   Physical Exam  Constitutional: She is oriented to person, place, and time. She appears well-developed and well-nourished.  HENT:  Head: Normocephalic and atraumatic.  Eyes: Pupils are equal, round, and reactive to light. Conjunctivae are normal. Right eye exhibits no discharge. Left eye exhibits no discharge. No scleral icterus.  Neck: Normal range of motion. No JVD present. No tracheal deviation present.  Cardiovascular: Normal rate, regular rhythm, normal heart sounds and intact distal pulses. Exam reveals no gallop and no friction rub.  No murmur heard. Pulmonary/Chest: Effort normal and breath sounds normal. No stridor. No respiratory distress. She has no wheezes. She has no rales. She exhibits no tenderness.  Neurological: She is alert and oriented to person, place, and time. She has normal strength. No cranial nerve deficit or sensory deficit. Coordination normal. GCS eye subscore is 4. GCS verbal subscore is 5. GCS motor subscore is 6.  Psychiatric: She has a normal mood and affect. Her behavior is normal. Judgment and thought content normal.  Nursing note and vitals reviewed.    ED Treatments / Results  Labs (all labs ordered are listed, but only abnormal results are displayed) Labs Reviewed - No data to display  EKG None  Radiology No results found.  Procedures Procedures (including critical care time)  Medications Ordered in ED Medications  ipratropium-albuterol (DUONEB) 0.5-2.5 (3) MG/3ML nebulizer solution 3 mL (3 mLs Nebulization Given 02/09/18 1230)  ketorolac (TORADOL) 30 MG/ML injection 30 mg (30 mg Intramuscular Given 02/09/18 1229)     Initial Impression / Assessment and Plan / ED Course  I have reviewed the triage vital signs and the nursing  notes.  Pertinent labs & imaging results that were available during my care of the patient were reviewed by me and considered in my medical decision making (see chart for details).     Labs:   Imaging:  Consults:  Therapeutics: duoneb, toradol   Discharge Meds:   Assessment/Plan: 18 YOF presents today with complaints of asthma exacerbation.  Clear lung sounds reassuring vital signs.  Patient also having migraine no red flags.  Should be referred to neurology, she is given strict return precautions, she verbalized understanding and agreement to today's plan.   Final Clinical Impressions(s) / ED Diagnoses   Final diagnoses:  Mild intermittent asthma with exacerbation  Acute nonintractable headache,  unspecified headache type    ED Discharge Orders         Ordered    albuterol (PROVENTIL HFA;VENTOLIN HFA) 108 (90 Base) MCG/ACT inhaler  Every 6 hours PRN     02/09/18 1316           Eyvonne Mechanic, PA-C 02/09/18 1317    Sabas Sous, MD 02/09/18 2018

## 2018-02-17 ENCOUNTER — Other Ambulatory Visit (HOSPITAL_COMMUNITY)
Admission: RE | Admit: 2018-02-17 | Discharge: 2018-02-17 | Disposition: A | Discharge status: Home / Self Care | Payer: Medicaid Other | Source: Ambulatory Visit | Attending: Obstetrics | Admitting: Obstetrics

## 2018-02-17 ENCOUNTER — Encounter: Payer: Self-pay | Admitting: Obstetrics

## 2018-02-17 ENCOUNTER — Ambulatory Visit (INDEPENDENT_AMBULATORY_CARE_PROVIDER_SITE_OTHER): Payer: Medicaid Other | Admitting: Obstetrics

## 2018-02-17 VITALS — BP 110/74 | HR 87 | Ht 68.5 in | Wt 153.6 lb

## 2018-02-17 DIAGNOSIS — N898 Other specified noninflammatory disorders of vagina: Secondary | ICD-10-CM

## 2018-02-17 DIAGNOSIS — B9689 Other specified bacterial agents as the cause of diseases classified elsewhere: Secondary | ICD-10-CM

## 2018-02-17 DIAGNOSIS — Z01419 Encounter for gynecological examination (general) (routine) without abnormal findings: Secondary | ICD-10-CM | POA: Diagnosis not present

## 2018-02-17 DIAGNOSIS — Z3046 Encounter for surveillance of implantable subdermal contraceptive: Secondary | ICD-10-CM

## 2018-02-17 DIAGNOSIS — Z0000 Encounter for general adult medical examination without abnormal findings: Secondary | ICD-10-CM | POA: Diagnosis not present

## 2018-02-17 DIAGNOSIS — N760 Acute vaginitis: Secondary | ICD-10-CM

## 2018-02-17 NOTE — Progress Notes (Signed)
Subjective:        [name] is a 37 y.o. female here for a routine exam.  Current complaints: Malodorous vaginal discharge..    Personal health questionnaire:  Is patient [name], have a family history of breast and/or ovarian cancer: no Is there a family history of uterine cancer diagnosed at age < 43, gastrointestinal cancer, urinary tract cancer, family member who is a Personnel officer syndrome-associated carrier: no Is the patient overweight and hypertensive, family history of diabetes, personal history of gestational diabetes, preeclampsia or PCOS: no Is patient over 78, have PCOS,  family history of premature CHD under age 72, diabetes, smoke, have hypertension or peripheral artery disease:  no At any time, has a partner hit, kicked or otherwise hurt or frightened you?: no Over the past 2 weeks, have you felt down, depressed or hopeless?: no Over the past 2 weeks, have you felt little interest or pleasure in doing things?:no   Gynecologic History Patient's last menstrual period was 02/09/2018. Contraception: Nexplanon Last Pap: 2018. Results were: normal Last mammogram: n/a. Results were: n/a  Obstetric History OB History  Gravida Para Term Preterm AB Living  4 3 2 1 1 3   SAB TAB Ectopic Multiple Live Births    1   0 3    # Outcome Date GA Lbr Len/2nd Weight Sex Delivery Anes PTL Lv  4 Preterm 02/08/14 [redacted]w[redacted]d  5 lb 12.8 oz (2.63 kg) M CS-LTranv Spinal  LIV  3 Term 02/27/05    M CS-LTranv   LIV  2 Term 11/16/01    F CS-LTranv   LIV  1 TAB             Past Medical History:  Diagnosis Date  . Allergy   . Anemia   . Asthma   . GERD (gastroesophageal reflux disease)   . Headache(784.0)   . Heart murmur     Past Surgical History:  Procedure Laterality Date  . CESAREAN SECTION  02/27/2005  . CESAREAN SECTION N/A 02/08/2014   Procedure: REPEAT  CESAREAN SECTION;  Surgeon: Antionette Char, MD;  Location: [facility];  Service: Obstetrics;  Laterality: N/A;  .  CESAREAN SECTION  11/16/2001     Current Outpatient Medications:  .  albuterol (PROVENTIL HFA;VENTOLIN HFA) 108 (90 Base) MCG/ACT inhaler, Inhale 1-2 puffs into the lungs every 6 (six) hours as needed for wheezing or shortness of breath., Disp: 1 Inhaler, Rfl: 0 Allergies  Allergen Reactions  . Shrimp [Shellfish Allergy] Anaphylaxis  . Iodinated Diagnostic Agents     Unknown    Social History   Tobacco Use  . Smoking status: Never Smoker  . Smokeless tobacco: Never Used  Substance Use Topics  . Alcohol use: No    Alcohol/week: 0.0 standard drinks    Family History  Problem Relation Age of Onset  . Diabetes Other   . Cancer Other   . Ovarian cancer Mother   . Lung cancer Mother   . Heart defect Daughter   . Heart disease Maternal Grandmother   . Arthritis Maternal Grandmother   . Cancer Maternal Grandmother   . Diabetes Maternal Grandmother   . Hyperlipidemia Maternal Grandmother   . Depression Sister   . COPD Maternal Grandfather   . Arthritis Paternal Grandmother       Review of Systems  Constitutional: negative for fatigue and weight loss Respiratory: negative for cough and wheezing Cardiovascular: negative for chest pain, fatigue and palpitations Gastrointestinal: negative for abdominal pain and change in  bowel habits Musculoskeletal:negative for myalgias Neurological: negative for gait problems and tremors Behavioral/Psych: negative for abusive relationship, depression Endocrine: negative for temperature intolerance    Genitourinary:negative for abnormal menstrual periods, genital lesions, hot flashes, sexual problems and vaginal discharge Integument/breast: negative for breast lump, breast tenderness, nipple discharge and skin lesion(s)    Objective:       BP 110/74   Pulse 87   Ht 5' 8.5" (1.74 m)   Wt 153 lb 9.6 oz (69.7 kg)   LMP 02/09/2018   BMI 23.02 kg/m  General:   alert and no distress  Skin:   no rash or abnormalities  Lungs:   clear to  auscultation bilaterally  Heart:   regular rate and rhythm, S1, S2 normal, no murmur, click, rub or gallop  Breasts:   normal without suspicious masses, skin or nipple changes or axillary nodes  Abdomen:  normal findings: no organomegaly, soft, non-tender and no hernia  Pelvis:  External genitalia: normal general appearance Urinary system: urethral meatus normal and bladder without fullness, nontender Vaginal: normal without tenderness, induration or masses Cervix: normal appearance Adnexa: normal bimanual exam Uterus: anteverted and non-tender, normal size   Lab Review Urine pregnancy test Labs reviewed yes Radiologic studies reviewed no   50% of 20 min visit spent on counseling and coordination of care.   Assessment:     1. Encounter for gynecological examination with Papanicolaou smear of cervix Rx: - Cytology - PAP( [place])  2. Vaginal discharge  3. BV (bacterial vaginosis) Rx: Dolores Frame- Nuvessa sample dispensed with instructions  4. Encounter for surveillance of implantable subdermal contraceptive - pleased with Nexplanon    Plan:    Education reviewed: calcium supplements, depression evaluation, low fat, low cholesterol diet, safe sex/STD prevention, self breast exams and weight bearing exercise. Contraception: Nexplanon. Follow up in: 1 year.   No orders of the defined types were placed in this encounter.  No orders of the defined types were placed in this encounter.   Brock BadHARLES A. HARPER MD 02-17-2018

## 2018-02-17 NOTE — Patient Instructions (Signed)
Bacterial Vaginosis Bacterial vaginosis is a vaginal infection that occurs when the normal balance of bacteria in the vagina is disrupted. It results from an overgrowth of certain bacteria. This is the most common vaginal infection among women ages 15-44. Because bacterial vaginosis increases your risk for STIs (sexually transmitted infections), getting treated can help reduce your risk for chlamydia, gonorrhea, herpes, and HIV (human immunodeficiency virus). Treatment is also important for preventing complications in pregnant women, because this condition can cause an early (premature) delivery. What are the causes? This condition is caused by an increase in harmful bacteria that are normally present in small amounts in the vagina. However, the reason that the condition develops is not fully understood. What increases the risk? The following factors may make you more likely to develop this condition:  Having a new sexual partner or multiple sexual partners.  Having unprotected sex.  Douching.  Having an intrauterine device (IUD).  Smoking.  Drug and alcohol abuse.  Taking certain antibiotic medicines.  Being pregnant.  You cannot get bacterial vaginosis from toilet seats, bedding, swimming pools, or contact with objects around you. What are the signs or symptoms? Symptoms of this condition include:  Grey or white vaginal discharge. The discharge can also be watery or foamy.  A fish-like odor with discharge, especially after sexual intercourse or during menstruation.  Itching in and around the vagina.  Burning or pain with urination.  Some women with bacterial vaginosis have no signs or symptoms. How is this diagnosed? This condition is diagnosed based on:  Your medical history.  A physical exam of the vagina.  Testing a sample of vaginal fluid under a microscope to look for a large amount of bad bacteria or abnormal cells. Your health care provider may use a cotton swab  or a small wooden spatula to collect the sample.  How is this treated? This condition is treated with antibiotics. These may be given as a pill, a vaginal cream, or a medicine that is put into the vagina (suppository). If the condition comes back after treatment, a second round of antibiotics may be needed. Follow these instructions at home: Medicines  Take over-the-counter and prescription medicines only as told by your health care provider.  Take or use your antibiotic as told by your health care provider. Do not stop taking or using the antibiotic even if you start to feel better. General instructions  If you have a female sexual partner, tell her that you have a vaginal infection. She should see her health care provider and be treated if she has symptoms. If you have a female sexual partner, he does not need treatment.  During treatment: ? Avoid sexual activity until you finish treatment. ? Do not douche. ? Avoid alcohol as directed by your health care provider. ? Avoid breastfeeding as directed by your health care provider.  Drink enough water and fluids to keep your urine clear or pale yellow.  Keep the area around your vagina and rectum clean. ? Wash the area daily with warm water. ? Wipe yourself from front to back after using the toilet.  Keep all follow-up visits as told by your health care provider. This is important. How is this prevented?  Do not douche.  Wash the outside of your vagina with warm water only.  Use protection when having sex. This includes latex condoms and dental dams.  Limit how many sexual partners you have. To help prevent bacterial vaginosis, it is best to have sex with just   one partner (monogamous).  Make sure you and your sexual partner are tested for STIs.  Wear cotton or cotton-lined underwear.  Avoid wearing tight pants and pantyhose, especially during summer.  Limit the amount of alcohol that you drink.  Do not use any products that  contain nicotine or tobacco, such as cigarettes and e-cigarettes. If you need help quitting, ask your health care provider.  Do not use illegal drugs. Where to find more information:  Centers for Disease Control and Prevention: www.cdc.gov/std  American Sexual Health Association (ASHA): www.ashastd.org  U.[place]. Department of Health and Human Services, Office on Women's Health: www.womenshealth.gov/ or https://www.womenshealth.gov/a-z-topics/bacterial-vaginosis Contact a health care provider if:  Your symptoms do not improve, even after treatment.  You have more discharge or pain when urinating.  You have a fever.  You have pain in your abdomen.  You have pain during sex.  You have vaginal bleeding between periods. Summary  Bacterial vaginosis is a vaginal infection that occurs when the normal balance of bacteria in the vagina is disrupted.  Because bacterial vaginosis increases your risk for STIs (sexually transmitted infections), getting treated can help reduce your risk for chlamydia, gonorrhea, herpes, and HIV (human immunodeficiency virus). Treatment is also important for preventing complications in pregnant women, because the condition can cause an early (premature) delivery.  This condition is treated with antibiotic medicines. These may be given as a pill, a vaginal cream, or a medicine that is put into the vagina (suppository). This information is not intended to replace advice given to you by your health care provider. Make sure you discuss any questions you have with your health care provider. Document Released: 02/24/2005 Document Revised: 06/30/2016 Document Reviewed: 11/10/2015 Elsevier Interactive Patient Education  2018 Elsevier Inc.  

## 2018-02-17 NOTE — Progress Notes (Signed)
Pt presents for annual exam. Pt last pap 01/22/17 Normal.

## 2018-02-19 LAB — CYTOLOGY - PAP
Diagnosis: NEGATIVE
HPV: NOT DETECTED

## 2018-03-17 ENCOUNTER — Ambulatory Visit: Payer: Medicaid Other | Admitting: Obstetrics

## 2018-03-23 ENCOUNTER — Ambulatory Visit (INDEPENDENT_AMBULATORY_CARE_PROVIDER_SITE_OTHER): Payer: Medicaid Other | Admitting: Obstetrics

## 2018-03-23 ENCOUNTER — Encounter: Payer: Self-pay | Admitting: Obstetrics

## 2018-03-23 VITALS — BP 110/71 | HR 79 | Resp 16 | Ht 68.5 in | Wt 153.4 lb

## 2018-03-23 DIAGNOSIS — N760 Acute vaginitis: Secondary | ICD-10-CM

## 2018-03-23 DIAGNOSIS — Z30011 Encounter for initial prescription of contraceptive pills: Secondary | ICD-10-CM

## 2018-03-23 DIAGNOSIS — Z3046 Encounter for surveillance of implantable subdermal contraceptive: Secondary | ICD-10-CM | POA: Diagnosis not present

## 2018-03-23 DIAGNOSIS — Z3009 Encounter for other general counseling and advice on contraception: Secondary | ICD-10-CM

## 2018-03-23 DIAGNOSIS — B9689 Other specified bacterial agents as the cause of diseases classified elsewhere: Secondary | ICD-10-CM

## 2018-03-23 MED ORDER — LEVONORGEST-ETH ESTRADIOL-IRON 0.1-20 MG-MCG(21) PO TABS: 1.0000 | ORAL_TABLET | Freq: Every day | ORAL | 11 refills | Status: DC

## 2018-03-23 MED ORDER — METRONIDAZOLE 500 MG PO TABS: 500.0000 mg | ORAL_TABLET | Freq: Two times a day (BID) | ORAL | 0 refills | Status: DC

## 2018-03-23 NOTE — Progress Notes (Signed)
NEXPLANON REMOVAL NOTE  Date of LMP:   unknown  Contraception used: *Nexplanon   Indications:  The patient desires contraception.  She understands risks, benefits, and alternatives to Implanon and would like to proceed.  Anesthesia:   Lidocaine 1% plain.  Procedure:  A time-out was performed confirming the procedure and the patient's allergy status.  Complications: None                      The rod was palpated and the area was sterilely prepped.  The area beneath the distal tip was anesthetized with 1% xylocaine and the skin incised                       Over the tip and the tip was exposed, grasped with forcep and removed intact.  A single suture of 4-0 Vicryl was used to close incision.  Steri strip                       And a bandage applied and the arm was wrapped with gauze bandage.  The patient tolerated well.  Instructions:  The patient was instructed to remove the dressing in 24 hours and that some bruising is to be expected.  She was advised to use over the counter analgesics as needed for any pain at the site.  She is to keep the area dry for 24 hours and to call if her hand or arm becomes cold, numb, or blue.  Return visit:  Return in 2 weeks    [name] MD 03-23-2018

## 2018-04-08 ENCOUNTER — Ambulatory Visit: Payer: Medicaid Other | Admitting: Obstetrics

## 2019-12-25 ENCOUNTER — Encounter (HOSPITAL_COMMUNITY): Payer: Self-pay

## 2019-12-25 ENCOUNTER — Emergency Department (HOSPITAL_COMMUNITY)
Admission: EM | Admit: 2019-12-25 | Discharge: 2019-12-25 | Disposition: A | Discharge status: Home / Self Care | Payer: Medicaid Other | Attending: Emergency Medicine | Admitting: Emergency Medicine

## 2019-12-25 ENCOUNTER — Emergency Department (HOSPITAL_COMMUNITY): Payer: Medicaid Other

## 2019-12-25 ENCOUNTER — Ambulatory Visit: Payer: Self-pay

## 2019-12-25 ENCOUNTER — Other Ambulatory Visit: Payer: Self-pay

## 2019-12-25 DIAGNOSIS — U099 Post covid-19 condition, unspecified: Secondary | ICD-10-CM | POA: Diagnosis not present

## 2019-12-25 DIAGNOSIS — U071 COVID-19: Secondary | ICD-10-CM | POA: Diagnosis not present

## 2019-12-25 DIAGNOSIS — Z7951 Long term (current) use of inhaled steroids: Secondary | ICD-10-CM | POA: Diagnosis not present

## 2019-12-25 DIAGNOSIS — J45909 Unspecified asthma, uncomplicated: Secondary | ICD-10-CM | POA: Insufficient documentation

## 2019-12-25 DIAGNOSIS — R42 Dizziness and giddiness: Secondary | ICD-10-CM

## 2019-12-25 LAB — I-STAT BETA HCG BLOOD, ED (MC, WL, AP ONLY): I-stat hCG, quantitative: <5 m[IU]/mL (ref <5)

## 2019-12-25 LAB — COMPREHENSIVE METABOLIC PANEL
ALT: 24 U/L (ref 0–44)
AST: 24 U/L (ref 15–41)
Albumin: 4 g/dL (ref 3.5–5.0)
Alkaline Phosphatase: 40 U/L (ref 38–126)
Anion gap: 10 (ref 5–15)
BUN: 8 mg/dL (ref 6–20)
CO2: 26 mmol/L (ref 22–32)
Calcium: 9.3 mg/dL (ref 8.9–10.3)
Chloride: 103 mmol/L (ref 98–111)
Creatinine, Ser: 0.7 mg/dL (ref 0.44–1.00)
GFR, Estimated: >60 mL/min (ref >60)
Glucose, Bld: 95 mg/dL (ref 70–99)
Potassium: 3.7 mmol/L (ref 3.5–5.1)
Sodium: 139 mmol/L (ref 135–145)
Total Bilirubin: 1.1 mg/dL (ref 0.3–1.2)
Total Protein: 7.7 g/dL (ref 6.5–8.1)

## 2019-12-25 LAB — URINALYSIS, ROUTINE W REFLEX MICROSCOPIC
Bilirubin Urine: NEGATIVE
Glucose, UA: NEGATIVE mg/dL
Ketones, ur: NEGATIVE mg/dL
Leukocytes,Ua: NEGATIVE
Nitrite: POSITIVE — AB
Protein, ur: NEGATIVE mg/dL
Specific Gravity, Urine: 1.017 (ref 1.005–1.030)
pH: 7 (ref 5.0–8.0)

## 2019-12-25 LAB — CBC WITH DIFFERENTIAL/PLATELET
Abs Immature Granulocytes: 0.01 10*3/uL (ref 0.00–0.07)
Basophils Absolute: 0 10*3/uL (ref 0.0–0.1)
Basophils Relative: 0 %
Eosinophils Absolute: 0.1 10*3/uL (ref 0.0–0.5)
Eosinophils Relative: 2 %
HCT: 36.8 % (ref 36.0–46.0)
Hemoglobin: 12 g/dL (ref 12.0–15.0)
Immature Granulocytes: 0 %
Lymphocytes Relative: 22 %
Lymphs Abs: 0.8 10*3/uL (ref 0.7–4.0)
MCH: 28.9 pg (ref 26.0–34.0)
MCHC: 32.6 g/dL (ref 30.0–36.0)
MCV: 88.7 fL (ref 80.0–100.0)
Monocytes Absolute: 0.3 10*3/uL (ref 0.1–1.0)
Monocytes Relative: 9 %
Neutro Abs: 2.5 10*3/uL (ref 1.7–7.7)
Neutrophils Relative %: 67 %
Platelets: 214 10*3/uL (ref 150–400)
RBC: 4.15 MIL/uL (ref 3.87–5.11)
RDW: 13.5 % (ref 11.5–15.5)
WBC: 3.7 10*3/uL — ABNORMAL LOW (ref 4.0–10.5)
nRBC: 0 % (ref 0.0–0.2)

## 2019-12-25 MED ORDER — LACTATED RINGERS IV BOLUS
500.0000 mL | Freq: Once | INTRAVENOUS | Status: AC
  Administered 2019-12-25: 500 mL via INTRAVENOUS

## 2019-12-25 MED ORDER — METOCLOPRAMIDE HCL 5 MG/ML IJ SOLN
10.0000 mg | Freq: Once | INTRAMUSCULAR | Status: AC
  Administered 2019-12-25: 10 mg via INTRAVENOUS
  Filled 2019-12-25: qty 2

## 2019-12-25 MED ORDER — DEXAMETHASONE SODIUM PHOSPHATE 10 MG/ML IJ SOLN
10.0000 mg | Freq: Once | INTRAMUSCULAR | Status: AC
  Administered 2019-12-25: 10 mg via INTRAVENOUS
  Filled 2019-12-25: qty 1

## 2019-12-25 MED ORDER — LACTATED RINGERS IV BOLUS
1000.0000 mL | Freq: Once | INTRAVENOUS | Status: AC
  Administered 2019-12-25: 1000 mL via INTRAVENOUS

## 2019-12-25 NOTE — ED Notes (Signed)
Precious Stokes,sister asking for update, [phone]

## 2019-12-25 NOTE — ED Notes (Signed)
Pt able to ambulated in room while maintain O2 saturation above 95% RA.  Pt also reports no dizziness.

## 2019-12-25 NOTE — ED Provider Notes (Signed)
[facility]-EMERGENCY DEPT Provider Note   CSN: [id number] Arrival date & time: 12/25/19  1516     History Chief Complaint  Patient presents with  . Dizziness    [name] is a 39 y.o. female.  HPI      [name] is a 39 y.o. female, with a history of anemia, asthma, GERD, migraines, presenting to the ED with dizziness intermittently for the last couple weeks.  She states she will feel episodes of room spinning dizziness that have occurred since she began having COVID symptoms. The episodes last for less than a minute before complete resolution. She has felt like the episodes are increasing in frequency. She had 5 such episodes today.   Fever/chills, fatigue, intermittent dizziness, cough, some shortness of breath beginning Oct 2. Positive covid test Oct 5. No fever for over a week. Denies any recent changes in medications. States, "I don't know why, but since I was diagnosed with COVID all I have been drinking is soda."  Denies new fever, new headaches, numbness, weakness, chest pain, syncope, head injury, vision changes, ear pain, acute hearing loss, abdominal pain, urinary symptoms, N/V/D, or any other complaints.  Past Medical History:  Diagnosis Date  . Allergy   . Anemia   . Asthma   . GERD (gastroesophageal reflux disease)   . Headache(784.0)   . Heart murmur     Patient Active Problem List   Diagnosis Date Noted  . S/P cesarean section 02/08/2014  . [redacted] weeks gestation of pregnancy   . Irregular contractions   . Hereditary disease in family possibly affecting fetus, affecting management of mother, antepartum condition or complication   . [redacted] weeks gestation of pregnancy   . Previous cesarean delivery affecting pregnancy   . [redacted] weeks gestation of pregnancy   . Poor fetal growth affecting management of mother in third trimester, antepartum   . Anemia during pregnancy 01/14/2014  . Abdominal pain affecting pregnancy, antepartum   .  Non-reactive NST (non-stress test)   . [redacted] weeks gestation of pregnancy   . Vasa previa 12/16/2013  . Abnormal EKG 09/05/2013  . Hyperemesis complicating pregnancy, antepartum 09/05/2013  . Nonspecific abnormal electrocardiogram (ECG) (EKG) 08/13/2013  . Nausea and vomiting in pregnancy prior to [redacted] weeks gestation 08/13/2013    Past Surgical History:  Procedure Laterality Date  . CESAREAN SECTION  02/27/2005  . CESAREAN SECTION N/A 02/08/2014   Procedure: REPEAT  CESAREAN SECTION;  Surgeon: Antionette Char, MD;  Location: [facility];  Service: Obstetrics;  Laterality: N/A;  . CESAREAN SECTION  11/16/2001     OB History    Gravida  4   Para  3   Term  2   Preterm  1   AB  1   Living  3     SAB      TAB  1   Ectopic      Multiple  0   Live Births  3           Family History  Problem Relation Age of Onset  . Diabetes Other   . Cancer Other   . Ovarian cancer Mother   . Lung cancer Mother   . Heart defect Daughter   . Heart disease Maternal Grandmother   . Arthritis Maternal Grandmother   . Cancer Maternal Grandmother   . Diabetes Maternal Grandmother   . Hyperlipidemia Maternal Grandmother   . Depression Sister   . COPD Maternal Grandfather   . Arthritis  Paternal Grandmother     Social History   Tobacco Use  . Smoking status: Never Smoker  . Smokeless tobacco: Never Used  Vaping Use  . Vaping Use: Never used  Substance Use Topics  . Alcohol use: No    Alcohol/week: 0.0 standard drinks  . Drug use: No    Home Medications Prior to Admission medications   Medication Sig Start Date End Date Taking? Authorizing Provider  acetaminophen (TYLENOL) 500 MG tablet Take 1,000 mg by mouth every 6 (six) hours as needed for moderate pain or fever.   Yes [provider]  albuterol (PROVENTIL HFA;VENTOLIN HFA) 108 (90 Base) MCG/ACT inhaler Inhale 1-2 puffs into the lungs every 6 (six) hours as needed for wheezing or shortness of breath. 02/09/18  Yes  Hedges, Tinnie Gens, PA-C  chlorhexidine (PERIDEX) 0.12 % solution Use as directed 15 mLs in the mouth or throat 2 (two) times daily.  08/07/19  Yes [provider]  fluticasone (FLONASE) 50 MCG/ACT nasal spray Place 1 spray into both nostrils daily.    Yes [provider]  SODIUM FLUORIDE 5000 PPM 1.1 % PSTE Take 1 application by mouth daily.  08/07/19  Yes [provider]  Levonorgest-Eth Estrad-Fe Bisg (BALCOLTRA) 0.1-20 MG-MCG(21) TABS Take 1 tablet by mouth daily. Patient not taking: Reported on 12/25/2019 03/23/18   [name], MD  metroNIDAZOLE (FLAGYL) 500 MG tablet Take 1 tablet (500 mg total) by mouth 2 (two) times daily. Patient not taking: Reported on 12/25/2019 03/23/18   [name], MD    Allergies    Shrimp [shellfish allergy]  Review of Systems   Review of Systems  Constitutional: Negative for chills, diaphoresis and fever.  Eyes: Negative for visual disturbance.  Respiratory: Negative for cough and shortness of breath.   Cardiovascular: Negative for chest pain and leg swelling.  Gastrointestinal: Negative for abdominal pain, diarrhea, nausea and vomiting.  Genitourinary: Negative for dysuria, flank pain and frequency.  Musculoskeletal: Negative for neck pain and neck stiffness.  Neurological: Positive for dizziness. Negative for syncope, weakness and numbness.  All other systems reviewed and are negative.   Physical Exam Updated Vital Signs BP 112/78 (BP Location: Right Arm)   Pulse 81   Temp 98.8 F (37.1 C) (Oral)   Resp 18   LMP 12/25/2019   SpO2 100%   Physical Exam Vitals and nursing note reviewed.  Constitutional:      General: She is not in acute distress.    Appearance: She is well-developed. She is not diaphoretic.  HENT:     Head: Normocephalic and atraumatic.     Mouth/Throat:     Mouth: Mucous membranes are moist.     Pharynx: Oropharynx is clear.  Eyes:     Conjunctiva/sclera: Conjunctivae normal.    Cardiovascular:     Rate and Rhythm: Normal rate and regular rhythm.     Pulses: Normal pulses.          Radial pulses are 2+ on the right side and 2+ on the left side.       Posterior tibial pulses are 2+ on the right side and 2+ on the left side.     Heart sounds: Normal heart sounds.     Comments: Tactile temperature in the extremities appropriate and equal bilaterally. Pulmonary:     Effort: Pulmonary effort is normal. No respiratory distress.     Breath sounds: Normal breath sounds.     Comments: No increased work of breathing.  Speaks in  full sentences without difficulty. Abdominal:     Palpations: Abdomen is soft.     Tenderness: There is no abdominal tenderness. There is no guarding.  Musculoskeletal:     Cervical back: Neck supple.     Right lower leg: No edema.     Left lower leg: No edema.  Lymphadenopathy:     Cervical: No cervical adenopathy.  Skin:    General: Skin is warm and dry.  Neurological:     Mental Status: She is alert and oriented to person, place, and time.     Comments: No noted acute cognitive deficit. Sensation grossly intact to light touch in the extremities.   Grip strengths equal bilaterally.   Strength 5/5 in all extremities.  No gait disturbance.  Although the patient was able to ambulate independently, she did state she felt dizziness arise with sitting and standing. Coordination intact.  Cranial nerves III-XII grossly intact.  Handles oral secretions without noted difficulty.  No noted phonation or speech deficit. No facial droop.   Psychiatric:        Mood and Affect: Mood and affect normal.        Speech: Speech normal.        Behavior: Behavior normal.     ED Results / Procedures / Treatments   Labs (all labs ordered are listed, but only abnormal results are displayed) Labs Reviewed  URINALYSIS, ROUTINE W REFLEX MICROSCOPIC - Abnormal; Notable for the following components:      Result Value   Hgb urine dipstick SMALL (*)     Nitrite POSITIVE (*)    Bacteria, UA MANY (*)    All other components within normal limits  CBC WITH DIFFERENTIAL/PLATELET - Abnormal; Notable for the following components:   WBC 3.7 (*)    All other components within normal limits  URINE CULTURE  COMPREHENSIVE METABOLIC PANEL  I-STAT BETA HCG BLOOD, ED ([facility], [facility], [facility] ONLY)    EKG EKG Interpretation  Date/Time:  Sunday December 25 2019 15:28:51 EDT Ventricular Rate:  82 PR Interval:  156 QRS Duration: 82 QT Interval:  344 QTC Calculation: 401 R Axis:   91 Text Interpretation: Normal sinus rhythm Biatrial enlargement Rightward axis Pulmonary disease pattern T wave abnormality Abnormal ECG Confirmed by Gerhard Munch [phone]) on 12/25/2019 9:27:29 PM   Radiology DG Chest 2 View  Result Date: 12/25/2019 CLINICAL DATA:  Shortness of breath. EXAM: CHEST - 2 VIEW COMPARISON:  Jul 24, 2014 FINDINGS: Heart, hila, mediastinum are normal. No pneumothorax possible subtle infiltrate periphery of left lobe and in the. IMPRESSION: Possible subtle infiltrates left upper lobe in the right lower lobe raise the possibility of pneumonia given history. Recommend follow-up imaging to ensure resolution. Electronically Signed   By: Gerome Sam III M.D   On: 12/25/2019 16:11    Procedures Procedures (including critical care time)  Medications Ordered in ED Medications  lactated ringers bolus 1,000 mL (0 mLs Intravenous Stopped 12/25/19 1951)  dexamethasone (DECADRON) injection 10 mg (10 mg Intravenous Given 12/25/19 1820)  metoCLOPramide (REGLAN) injection 10 mg (10 mg Intravenous Given 12/25/19 1820)  lactated ringers bolus 500 mL (500 mLs Intravenous New Bag/Given 12/25/19 1950)    ED Course  I have reviewed the triage vital signs and the nursing notes.  Pertinent labs & imaging results that were available during my care of the patient were reviewed by me and considered in my medical decision making (see chart for details).  Clinical Course  as of Dec 25 2134  Wynelle LinkSun Dec 25, 2019  1930 Patient states she feels much better with resolution in her symptoms.   [SJ]    Clinical Course User Index [SJ] Iriana Artley, Hillard DankerShawn C, PA-C   MDM Rules/Calculators/A&P                          Patient presents with complaint of intermittent dizziness.  She has no focal neurologic deficits on exam. Patient is nontoxic appearing, afebrile, not tachycardic, not tachypneic, not hypotensive, maintains excellent SPO2 on room air, and is in no apparent distress.   I have reviewed the patient's chart to obtain more information.   I reviewed and interpreted the patient's labs and radiological studies. Mild leukopenia, which can be an expected finding in Covid and Peri-COVID infection periods. Chest x-ray abnormalities not unexpected at the end of the Covid infection. Patient had no recurrence of her symptoms here in the ED.  Many bacteria and nitrites on urinalysis, but patient denies any urinary symptoms.  Urine sent for culture.  The patient was given instructions for home care as well as return precautions. Patient voices understanding of these instructions, accepts the plan, and is comfortable with discharge.   Findings and plan of care discussed with Adalberto Coleob Lockwood, MD.   Vitals:   12/25/19 1538 12/25/19 1753 12/25/19 1930  BP: 112/78 111/88 111/74  Pulse: 81 65 73  Resp: 18 18 16   Temp: 98.8 F (37.1 C)    TempSrc: Oral    SpO2: 100% 98% 99%     Final Clinical Impression(s) / ED Diagnoses Final diagnoses:  Dizziness    Rx / DC Orders ED Discharge Orders    None       Concepcion LivingJoy, Karem Farha C, PA-C 12/25/19 2146    Gerhard MunchLockwood, Robert, MD 12/25/19 2326

## 2019-12-25 NOTE — ED Triage Notes (Signed)
Pt arrived via walk in, COVID (+) x1 week, denies any n/v or fevers. C/o dizzy spells on and off since.

## 2019-12-25 NOTE — Telephone Encounter (Signed)
Chart made in error (see triage note) This encounter was created in error - please disregard.

## 2019-12-25 NOTE — ED Notes (Signed)
Pt to be discharged after completing fluid bolus.

## 2019-12-25 NOTE — Telephone Encounter (Signed)
Pt c/o vertigo "feels like room is spinning." Pt stated that she is having nausea and feels she may be dehydrated. Pt stated that she has had minimal po fluid intake. Denied numbness or tingling to either side of face, arm or leg or foot.  Onset of sx 12/13/19 with nausea and diarrhea for 1 week. Denies blurred vision Pt complained that the vertigo has worsened so much to the point where she lays on the floor to try and get blood flow to her brain. Initially the vertigo would subside now it is constant. She requires assistance for balance.  Pt sounded very ill and weak over the phone.  Pt is positive for covid-19.  Pt also denied earache. Advised pt to call 911 for evaluation. Pt verbalized understanding.  Reason for Disposition . SEVERE dizziness (vertigo) (e.g., unable to walk without assistance)  Answer Assessment - Initial Assessment Questions 1. DESCRIPTION: "Describe your dizziness."     Room spinning 2. VERTIGO: "Do you feel like either you or the room is spinning or tilting?"      spinning 3. LIGHTHEADED: "Do you feel lightheaded?" (e.g., somewhat faint, woozy, weak upon standing)     yes 4. SEVERITY: "How bad is it?"  "Can you walk?"   - MILD: Feels unsteady but walking normally.   - MODERATE: Feels very unsteady when walking, but not falling; interferes with normal activities (e.g., school, work) .   - SEVERE: Unable to walk without falling, or requires assistance to walk without falling.     Moderate to severe- pt has not fallen but is not able to walk without assistance 5. ONSET:  "When did the dizziness begin?"     12/13/19 6. AGGRAVATING FACTORS: "Does anything make it worse?" (e.g., standing, change in head position)     Standing, sitting, laying supine, 7. CAUSE: "What do you think is causing the dizziness?"     Covid sx, dehydration  9. OTHER SYMPTOMS: "Do you have any other symptoms?" (e.g., headache, weakness, numbness, vomiting, earache)     Nausea,   fatigue  Protocols used: DIZZINESS - VERTIGO-A-AH

## 2019-12-25 NOTE — Discharge Instructions (Addendum)
  Dehydration typically causes its own symptoms including lightheadedness, nausea, headaches, fatigue increased thirst, and generally feeling unwell. Drink plenty of fluids and get plenty of rest. You should be drinking at least half a liter of water every hour or two to stay hydrated. Electrolyte drinks (ex. Gatorade, Powerade, Pedialyte) are also encouraged. You should be drinking enough fluids to make your urine light yellow, almost clear. If this is not the case, you are not drinking enough water.  Headache Your lab results showed no acute abnormalities.  For future headaches please try the following regimen: Antiinflammatory medications: Take 600 mg of ibuprofen every 6 hours or 440 mg (over the counter dose) to 500 mg (prescription dose) of naproxen every 12 hours.  Use this regimen until headache subsides for up to 3 days .  After this time, these medications may be used as needed for pain. Take these medications with food to avoid upset stomach. Choose only one of these medications, do not take them together. Acetaminophen: Should you continue to have additional pain while taking the ibuprofen or naproxen, you may add in acetaminophen (generic for Tylenol) as needed. Your daily total maximum amount of acetaminophen from all sources should be limited to 4000mg /day for persons without liver problems, or 2000mg /day for those with liver problems.  Hydration: Have a goal of about a half liter of water every couple hours to stay well hydrated.   Sleep: Please be sure to get plenty of sleep with a goal of 8 hours per night. Having a regular bed time and bedtime routine can help with this.  Screens: Reduce the amount of time you are in front of screens.  Take about a 5-10-minute break every hour or every couple hours to give your eyes rest.  Do not use screens in dark rooms.  Glasses with a blue light filter may also help reduce eye fatigue.  Stress: Take steps to reduce stress as much as possible.    Follow up: Follow-up with your primary care provider on this issue.  May also need to follow-up with the neurologist for increased frequency of headaches. There were some changes to the EKG when compared to an EKG from 5 years ago.  This should be reevaluated by your primary care provider.

## 2019-12-27 LAB — URINE CULTURE

## 2020-03-12 ENCOUNTER — Telehealth: Payer: Self-pay | Admitting: Neurology

## 2020-03-12 ENCOUNTER — Ambulatory Visit: Payer: Self-pay | Admitting: Neurology

## 2020-03-12 ENCOUNTER — Encounter: Payer: Self-pay | Admitting: Neurology

## 2020-03-12 ENCOUNTER — Other Ambulatory Visit: Payer: Self-pay

## 2020-03-12 ENCOUNTER — Ambulatory Visit: Payer: Medicaid Other | Admitting: Neurology

## 2020-03-12 VITALS — BP 111/69 | HR 75 | Ht 68.5 in | Wt 157.0 lb

## 2020-03-12 DIAGNOSIS — G43709 Chronic migraine without aura, not intractable, without status migrainosus: Secondary | ICD-10-CM

## 2020-03-12 DIAGNOSIS — R51 Headache with orthostatic component, not elsewhere classified: Secondary | ICD-10-CM

## 2020-03-12 DIAGNOSIS — R42 Dizziness and giddiness: Secondary | ICD-10-CM | POA: Diagnosis not present

## 2020-03-12 DIAGNOSIS — R519 Headache, unspecified: Secondary | ICD-10-CM

## 2020-03-12 DIAGNOSIS — G43809 Other migraine, not intractable, without status migrainosus: Secondary | ICD-10-CM | POA: Diagnosis not present

## 2020-03-12 DIAGNOSIS — H547 Unspecified visual loss: Secondary | ICD-10-CM

## 2020-03-12 MED ORDER — NORTRIPTYLINE HCL 10 MG PO CAPS: 10.0000 mg | ORAL_CAPSULE | Freq: Every day | ORAL | 3 refills | Status: DC

## 2020-03-12 MED ORDER — ONDANSETRON 4 MG PO TBDP: 4.0000 mg | ORAL_TABLET | Freq: Three times a day (TID) | ORAL | 3 refills | Status: DC | PRN

## 2020-03-12 MED ORDER — METHYLPREDNISOLONE 4 MG PO TBPK: ORAL_TABLET | ORAL | 1 refills | Status: DC

## 2020-03-12 MED ORDER — RIZATRIPTAN BENZOATE 10 MG PO TBDP: 10.0000 mg | ORAL_TABLET | ORAL | 11 refills | Status: DC | PRN

## 2020-03-12 MED ORDER — KETOROLAC TROMETHAMINE 60 MG/2ML IM SOLN
60.0000 mg | Freq: Once | INTRAMUSCULAR | Status: AC
  Administered 2020-03-12: 60 mg via INTRAMUSCULAR

## 2020-03-12 NOTE — Progress Notes (Signed)
Pt here for migraines, Toradol injection ordered 60mg /44ml. Under aseptic technique toradol 60mg  /58ml IM given. .  Tolerated well.  Bandaid applied.

## 2020-03-12 NOTE — Telephone Encounter (Signed)
[employer] community medicaid auth: [id number] (exp. 03/12/20 to 04/26/20) order sent to GI. They will reach out to the patient to schedule.

## 2020-03-12 NOTE — Patient Instructions (Addendum)
Nortriptyline at bedtime: for migraine prevention Medrol dosepak 6 days: to help with current symptoms Maxalt: Acutely for dizziness and vertigo and migraine: Please take one tablet at the onset of your headache. If it does not improve the symptoms please take one additional tablet. Do not take more then 2 tablets in 24hrs. Do not take use more then 2 to 3 times in a week. Ondansetron: for nausea or vertigo   Ondansetron oral dissolving tablet What is this medicine? ONDANSETRON (on DAN se tron) is used to treat nausea and vomiting caused by chemotherapy. It is also used to prevent or treat nausea and vomiting after surgery. This medicine may be used for other purposes; ask your health care provider or pharmacist if you have questions. COMMON BRAND NAME(S): Zofran ODT What should I tell my health care provider before I take this medicine? They need to know if you have any of these conditions:  heart disease  history of irregular heartbeat  liver disease  low levels of magnesium or potassium in the blood  an unusual or allergic reaction to ondansetron, granisetron, other medicines, foods, dyes, or preservatives  pregnant or trying to get pregnant  breast-feeding How should I use this medicine? These tablets are made to dissolve in the mouth. Do not try to push the tablet through the foil backing. With dry hands, peel away the foil backing and gently remove the tablet. Place the tablet in the mouth and allow it to dissolve, then swallow. While you may take these tablets with water, it is not necessary to do so. Talk to your pediatrician regarding the use of this medicine in children. Special care may be needed. Overdosage: If you think you have taken too much of this medicine contact a poison control center or emergency room at once. NOTE: This medicine is only for you. Do not share this medicine with others. What if I miss a dose? If you miss a dose, take it as soon as you can. If it  is almost time for your next dose, take only that dose. Do not take double or extra doses. What may interact with this medicine? Do not take this medicine with any of the following medications:  apomorphine  certain medicines for fungal infections like fluconazole, itraconazole, ketoconazole, posaconazole, voriconazole  cisapride  dronedarone  pimozide  thioridazine This medicine may also interact with the following medications:  carbamazepine  certain medicines for depression, anxiety, or psychotic disturbances  fentanyl  linezolid  MAOIs like Carbex, Eldepryl, Marplan, Nardil, and Parnate  methylene blue (injected into a vein)  other medicines that prolong the QT interval (cause an abnormal heart rhythm) like dofetilide, ziprasidone  phenytoin  rifampicin  tramadol This list may not describe all possible interactions. Give your health care provider a list of all the medicines, herbs, non-prescription drugs, or dietary supplements you use. Also tell them if you smoke, drink alcohol, or use illegal drugs. Some items may interact with your medicine. What should I watch for while using this medicine? Check with your doctor or health care professional as soon as you can if you have any sign of an allergic reaction. What side effects may I notice from receiving this medicine? Side effects that you should report to your doctor or health care professional as soon as possible:  allergic reactions like skin rash, itching or hives, swelling of the face, lips, or tongue  breathing problems  confusion  dizziness  fast or irregular heartbeat  feeling faint or lightheaded,  falls  fever and chills  loss of balance or coordination  seizures  sweating  swelling of the hands and feet  tightness in the chest  tremors  unusually weak or tired Side effects that usually do not require medical attention (report to your doctor or health care professional if they continue  or are bothersome):  constipation or diarrhea  headache This list may not describe all possible side effects. Call your doctor for medical advice about side effects. You may report side effects to FDA at 1-800-FDA-1088. Where should I keep my medicine? Keep out of the reach of children. Store between 2 and 30 degrees C (36 and 86 degrees F). Throw away any unused medicine after the expiration date. NOTE: This sheet is a summary. It may not cover all possible information. If you have questions about this medicine, talk to your doctor, pharmacist, or health care provider.  2020 Elsevier/Gold Standard (2018-02-16 07:14:10) Methylprednisolone tablets What is this medicine? METHYLPREDNISOLONE (meth ill pred NISS oh lone) is a corticosteroid. It is commonly used to treat inflammation of the skin, joints, lungs, and other organs. Common conditions treated include asthma, allergies, and arthritis. It is also used for other conditions, such as blood disorders and diseases of the adrenal glands. This medicine may be used for other purposes; ask your health care provider or pharmacist if you have questions. COMMON BRAND NAME(S): Medrol, Medrol Dosepak What should I tell my health care provider before I take this medicine? They need to know if you have any of these conditions:  Cushing's syndrome  eye disease, vision problems  diabetes  glaucoma  heart disease  high blood pressure  infection (especially a virus infection such as chickenpox, cold sores, or herpes)  liver disease  mental illness  myasthenia gravis  osteoporosis  recently received or scheduled to receive a vaccine  seizures  stomach or intestine problems  thyroid disease  an unusual or allergic reaction to lactose, methylprednisolone, other medicines, foods, dyes, or preservatives  pregnant or trying to get pregnant  breast-feeding How should I use this medicine? Take this medicine by mouth with a glass of  water. Follow the directions on the prescription label. Take this medicine with food. If you are taking this medicine once a day, take it in the morning. Do not take it more often than directed. Do not suddenly stop taking your medicine because you may develop a severe reaction. Your doctor will tell you how much medicine to take. If your doctor wants you to stop the medicine, the dose may be slowly lowered over time to avoid any side effects. Talk to your pediatrician regarding the use of this medicine in children. Special care may be needed. Overdosage: If you think you have taken too much of this medicine contact a poison control center or emergency room at once. NOTE: This medicine is only for you. Do not share this medicine with others. What if I miss a dose? If you miss a dose, take it as soon as you can. If it is almost time for your next dose, talk to your doctor or health care professional. You may need to miss a dose or take an extra dose. Do not take double or extra doses without advice. What may interact with this medicine? Do not take this medicine with any of the following medications:  alefacept  echinacea  live virus vaccines  metyrapone  mifepristone This medicine may also interact with the following medications:  amphotericin B  aspirin  and aspirin-like medicines  certain antibiotics like erythromycin, clarithromycin, troleandomycin  certain medicines for diabetes  certain medicines for fungal infections like ketoconazole  certain medicines for seizures like carbamazepine, phenobarbital, phenytoin  certain medicines that treat or prevent blood clots like warfarin  cholestyramine  cyclosporine  digoxin  diuretics  female hormones, like estrogens and birth control pills  isoniazid  NSAIDs, medicines for pain inflammation, like ibuprofen or naproxen  other medicines for myasthenia gravis  rifampin  vaccines This list may not describe all possible  interactions. Give your health care provider a list of all the medicines, herbs, non-prescription drugs, or dietary supplements you use. Also tell them if you smoke, drink alcohol, or use illegal drugs. Some items may interact with your medicine. What should I watch for while using this medicine? Tell your doctor or healthcare professional if your symptoms do not start to get better or if they get worse. Do not stop taking except on your doctor's advice. You may develop a severe reaction. Your doctor will tell you how much medicine to take. This medicine may increase your risk of getting an infection. Tell your doctor or health care professional if you are around anyone with measles or chickenpox, or if you develop sores or blisters that do not heal properly. This medicine may increase blood sugar levels. Ask your healthcare provider if changes in diet or medicines are needed if you have diabetes. Tell your doctor or health care professional right away if you have any change in your eyesight. Using this medicine for a long time may increase your risk of low bone mass. Talk to your doctor about bone health. What side effects may I notice from receiving this medicine? Side effects that you should report to your doctor or health care professional as soon as possible:  allergic reactions like skin rash, itching or hives, swelling of the face, lips, or tongue  bloody or tarry stools  hallucination, loss of contact with reality  muscle cramps  muscle pain  palpitations  signs and symptoms of high blood sugar such as being more thirsty or hungry or having to urinate more than normal. You may also feel very tired or have blurry vision.  signs and symptoms of infection like fever or chills; cough; sore throat; pain or trouble passing urine Side effects that usually do not require medical attention (report to your doctor or health care professional if they continue or are bothersome):  changes in  emotions or mood  constipation  diarrhea  excessive hair growth on the face or body  headache  nausea, vomiting  trouble sleeping  weight gain This list may not describe all possible side effects. Call your doctor for medical advice about side effects. You may report side effects to FDA at 1-800-FDA-1088. Where should I keep my medicine? Keep out of the reach of children. Store at room temperature between 20 and 25 degrees C (68 and 77 degrees F). Throw away any unused medicine after the expiration date. NOTE: This sheet is a summary. It may not cover all possible information. If you have questions about this medicine, talk to your doctor, pharmacist, or health care provider.  2020 Elsevier/Gold Standard (2017-11-26 09:19:36)  Rizatriptan disintegrating tablets What is this medicine? RIZATRIPTAN (rye za TRIP tan) is used to treat migraines with or without aura. An aura is a strange feeling or visual disturbance that warns you of an attack. It is not used to prevent migraines. This medicine may be used for other  purposes; ask your health care provider or pharmacist if you have questions. COMMON BRAND NAME(S): Maxalt-MLT What should I tell my health care provider before I take this medicine? They need to know if you have any of these conditions:  cigarette smoker  circulation problems in fingers and toes  diabetes  heart disease  high blood pressure  high cholesterol  history of irregular heartbeat  history of stroke  kidney disease  liver disease  stomach or intestine problems  an unusual or allergic reaction to rizatriptan, other medicines, foods, dyes, or preservatives  pregnant or trying to get pregnant  breast-feeding How should I use this medicine? Take this medicine by mouth. Follow the directions on the prescription label. Leave the tablet in the sealed blister pack until you are ready to take it. With dry hands, open the blister and gently remove the  tablet. If the tablet breaks or crumbles, throw it away and take a new tablet out of the blister pack. Place the tablet in the mouth and allow it to dissolve, and then swallow. Do not cut, crush, or chew this medicine. You do not need water to take this medicine. Do not take it more often than directed. Talk to your pediatrician regarding the use of this medicine in children. While this drug may be prescribed for children as young as 6 years for selected conditions, precautions do apply. Overdosage: If you think you have taken too much of this medicine contact a poison control center or emergency room at once. NOTE: This medicine is only for you. Do not share this medicine with others. What if I miss a dose? This does not apply. This medicine is not for regular use. What may interact with this medicine? Do not take this medicine with any of the following medicines:  certain medicines for migraine headache like almotriptan, eletriptan, frovatriptan, naratriptan, rizatriptan, sumatriptan, zolmitriptan  ergot alkaloids like dihydroergotamine, ergonovine, ergotamine, methylergonovine  MAOIs like Carbex, Eldepryl, Marplan, Nardil, and Parnate This medicine may also interact with the following medications:  certain medicines for depression, anxiety, or psychotic disorders  propranolol This list may not describe all possible interactions. Give your health care provider a list of all the medicines, herbs, non-prescription drugs, or dietary supplements you use. Also tell them if you smoke, drink alcohol, or use illegal drugs. Some items may interact with your medicine. What should I watch for while using this medicine? Visit your healthcare professional for regular checks on your progress. Tell your healthcare professional if your symptoms do not start to get better or if they get worse. You may get drowsy or dizzy. Do not drive, use machinery, or do anything that needs mental alertness until you know  how this medicine affects you. Do not stand up or sit up quickly, especially if you are an older patient. This reduces the risk of dizzy or fainting spells. Alcohol may interfere with the effect of this medicine. Your mouth may get dry. Chewing sugarless gum or sucking hard candy and drinking plenty of water may help. Contact your healthcare professional if the problem does not go away or is severe. If you take migraine medicines for 10 or more days a month, your migraines may get worse. Keep a diary of headache days and medicine use. Contact your healthcare professional if your migraine attacks occur more frequently. What side effects may I notice from receiving this medicine? Side effects that you should report to your doctor or health care professional as soon as possible:  allergic reactions like skin rash, itching or hives, swelling of the face, lips, or tongue  chest pain or chest tightness  signs and symptoms of a dangerous change in heartbeat or heart rhythm like chest pain; dizziness; fast, irregular heartbeat; palpitations; feeling faint or lightheaded; falls; breathing problems  signs and symptoms of a stroke like changes in vision; confusion; trouble speaking or understanding; severe headaches; sudden numbness or weakness of the face, arm or leg; trouble walking; dizziness; loss of balance or coordination  signs and symptoms of serotonin syndrome like irritable; confusion; diarrhea; fast or irregular heartbeat; muscle twitching; stiff muscles; trouble walking; sweating; high fever; seizures; chills; vomiting Side effects that usually do not require medical attention (report to your doctor or health care professional if they continue or are bothersome):  diarrhea  dizziness  drowsiness  dry mouth  headache  nausea, vomiting  pain, tingling, numbness in the hands or feet  stomach pain This list may not describe all possible side effects. Call your doctor for medical advice  about side effects. You may report side effects to FDA at 1-800-FDA-1088. Where should I keep my medicine? Keep out of the reach of children. Store at room temperature between 15 and 30 degrees C (59 and 86 degrees F). Protect from light and moisture. Throw away any unused medicine after the expiration date. NOTE: This sheet is a summary. It may not cover all possible information. If you have questions about this medicine, talk to your doctor, pharmacist, or health care provider.  2020 Elsevier/Gold Standard (2017-09-08 14:58:08) Methylprednisolone tablets What is this medicine? METHYLPREDNISOLONE (meth ill pred NISS oh lone) is a corticosteroid. It is commonly used to treat inflammation of the skin, joints, lungs, and other organs. Common conditions treated include asthma, allergies, and arthritis. It is also used for other conditions, such as blood disorders and diseases of the adrenal glands. This medicine may be used for other purposes; ask your health care provider or pharmacist if you have questions. COMMON BRAND NAME(S): Medrol, Medrol Dosepak What should I tell my health care provider before I take this medicine? They need to know if you have any of these conditions:  Cushing's syndrome  eye disease, vision problems  diabetes  glaucoma  heart disease  high blood pressure  infection (especially a virus infection such as chickenpox, cold sores, or herpes)  liver disease  mental illness  myasthenia gravis  osteoporosis  recently received or scheduled to receive a vaccine  seizures  stomach or intestine problems  thyroid disease  an unusual or allergic reaction to lactose, methylprednisolone, other medicines, foods, dyes, or preservatives  pregnant or trying to get pregnant  breast-feeding How should I use this medicine? Take this medicine by mouth with a glass of water. Follow the directions on the prescription label. Take this medicine with food. If you are  taking this medicine once a day, take it in the morning. Do not take it more often than directed. Do not suddenly stop taking your medicine because you may develop a severe reaction. Your doctor will tell you how much medicine to take. If your doctor wants you to stop the medicine, the dose may be slowly lowered over time to avoid any side effects. Talk to your pediatrician regarding the use of this medicine in children. Special care may be needed. Overdosage: If you think you have taken too much of this medicine contact a poison control center or emergency room at once. NOTE: This medicine is only for you.  Do not share this medicine with others. What if I miss a dose? If you miss a dose, take it as soon as you can. If it is almost time for your next dose, talk to your doctor or health care professional. You may need to miss a dose or take an extra dose. Do not take double or extra doses without advice. What may interact with this medicine? Do not take this medicine with any of the following medications:  alefacept  echinacea  live virus vaccines  metyrapone  mifepristone This medicine may also interact with the following medications:  amphotericin B  aspirin and aspirin-like medicines  certain antibiotics like erythromycin, clarithromycin, troleandomycin  certain medicines for diabetes  certain medicines for fungal infections like ketoconazole  certain medicines for seizures like carbamazepine, phenobarbital, phenytoin  certain medicines that treat or prevent blood clots like warfarin  cholestyramine  cyclosporine  digoxin  diuretics  female hormones, like estrogens and birth control pills  isoniazid  NSAIDs, medicines for pain inflammation, like ibuprofen or naproxen  other medicines for myasthenia gravis  rifampin  vaccines This list may not describe all possible interactions. Give your health care provider a list of all the medicines, herbs, non-prescription  drugs, or dietary supplements you use. Also tell them if you smoke, drink alcohol, or use illegal drugs. Some items may interact with your medicine. What should I watch for while using this medicine? Tell your doctor or healthcare professional if your symptoms do not start to get better or if they get worse. Do not stop taking except on your doctor's advice. You may develop a severe reaction. Your doctor will tell you how much medicine to take. This medicine may increase your risk of getting an infection. Tell your doctor or health care professional if you are around anyone with measles or chickenpox, or if you develop sores or blisters that do not heal properly. This medicine may increase blood sugar levels. Ask your healthcare provider if changes in diet or medicines are needed if you have diabetes. Tell your doctor or health care professional right away if you have any change in your eyesight. Using this medicine for a long time may increase your risk of low bone mass. Talk to your doctor about bone health. What side effects may I notice from receiving this medicine? Side effects that you should report to your doctor or health care professional as soon as possible:  allergic reactions like skin rash, itching or hives, swelling of the face, lips, or tongue  bloody or tarry stools  hallucination, loss of contact with reality  muscle cramps  muscle pain  palpitations  signs and symptoms of high blood sugar such as being more thirsty or hungry or having to urinate more than normal. You may also feel very tired or have blurry vision.  signs and symptoms of infection like fever or chills; cough; sore throat; pain or trouble passing urine Side effects that usually do not require medical attention (report to your doctor or health care professional if they continue or are bothersome):  changes in emotions or mood  constipation  diarrhea  excessive hair growth on the face or  body  headache  nausea, vomiting  trouble sleeping  weight gain This list may not describe all possible side effects. Call your doctor for medical advice about side effects. You may report side effects to FDA at 1-800-FDA-1088. Where should I keep my medicine? Keep out of the reach of children. Store at room temperature  between 20 and 25 degrees C (68 and 77 degrees F). Throw away any unused medicine after the expiration date. NOTE: This sheet is a summary. It may not cover all possible information. If you have questions about this medicine, talk to your doctor, pharmacist, or health care provider.  2020 Elsevier/Gold Standard (2017-11-26 09:19:36) Nortriptyline capsules What is this medicine? NORTRIPTYLINE (nor TRIP ti leen) is used to treat depression. This medicine may be used for other purposes; ask your health care provider or pharmacist if you have questions. COMMON BRAND NAME(S): Aventyl, Pamelor What should I tell my health care provider before I take this medicine? They need to know if you have any of these conditions:  bipolar disorder  Brugada syndrome  difficulty passing urine  glaucoma  heart disease  if you drink alcohol  liver disease  schizophrenia  seizures  suicidal thoughts, plans or attempt; a previous suicide attempt by you or a family member  thyroid disease  an unusual or allergic reaction to nortriptyline, other tricyclic antidepressants, other medicines, foods, dyes, or preservatives  pregnant or trying to get pregnant  breast-feeding How should I use this medicine? Take this medicine by mouth with a glass of water. Follow the directions on the prescription label. Take your doses at regular intervals. Do not take it more often than directed. Do not stop taking this medicine suddenly except upon the advice of your doctor. Stopping this medicine too quickly may cause serious side effects or your condition may worsen. A special MedGuide will  be given to you by the pharmacist with each prescription and refill. Be sure to read this information carefully each time. Talk to your pediatrician regarding the use of this medicine in children. Special care may be needed. Overdosage: If you think you have taken too much of this medicine contact a poison control center or emergency room at once. NOTE: This medicine is only for you. Do not share this medicine with others. What if I miss a dose? If you miss a dose, take it as soon as you can. If it is almost time for your next dose, take only that dose. Do not take double or extra doses. What may interact with this medicine? Do not take this medicine with any of the following medications:  cisapride  dronedarone  linezolid  MAOIs like Carbex, Eldepryl, Marplan, Nardil, and Parnate  methylene blue (injected into a vein)  pimozide  thioridazine This medicine may also interact with the following medications:  alcohol  antihistamines for allergy, cough, and cold  atropine  certain medicines for bladder problems like oxybutynin, tolterodine  certain medicines for depression like amitriptyline, fluoxetine, sertraline  certain medicines for Parkinson's disease like benztropine, trihexyphenidyl  certain medicines for stomach problems like dicyclomine, hyoscyamine  certain medicines for travel sickness like scopolamine  chlorpropamide  cimetidine  ipratropium  other medicines that prolong the QT interval (an abnormal heart rhythm) like dofetilide  other medicines that can cause serotonin syndrome like St. John's Wort, fentanyl, lithium, tramadol, tryptophan, buspirone, and some medicines for headaches like sumatriptan or rizatriptan  quinidine  reserpine  thyroid medicine This list may not describe all possible interactions. Give your health care provider a list of all the medicines, herbs, non-prescription drugs, or dietary supplements you use. Also tell them if you  smoke, drink alcohol, or use illegal drugs. Some items may interact with your medicine. What should I watch for while using this medicine? Tell your doctor if your symptoms do not get better  or if they get worse. Visit your doctor or health care professional for regular checks on your progress. Because it may take several weeks to see the full effects of this medicine, it is important to continue your treatment as prescribed by your doctor. Patients and their families should watch out for new or worsening thoughts of suicide or depression. Also watch out for sudden changes in feelings such as feeling anxious, agitated, panicky, irritable, hostile, aggressive, impulsive, severely restless, overly excited and hyperactive, or not being able to sleep. If this happens, especially at the beginning of treatment or after a change in dose, call your health care professional. Bonita Quin may get drowsy or dizzy. Do not drive, use machinery, or do anything that needs mental alertness until you know how this medicine affects you. Do not stand or sit up quickly, especially if you are an older patient. This reduces the risk of dizzy or fainting spells. Alcohol may interfere with the effect of this medicine. Avoid alcoholic drinks. Do not treat yourself for coughs, colds, or allergies without asking your doctor or health care professional for advice. Some ingredients can increase possible side effects. Your mouth may get dry. Chewing sugarless gum or sucking hard candy, and drinking plenty of water may help. Contact your doctor if the problem does not go away or is severe. This medicine may cause dry eyes and blurred vision. If you wear contact lenses you may feel some discomfort. Lubricating drops may help. See your eye doctor if the problem does not go away or is severe. This medicine can cause constipation. Try to have a bowel movement at least every 2 to 3 days. If you do not have a bowel movement for 3 days, call your doctor  or health care professional. This medicine can make you more sensitive to the sun. Keep out of the sun. If you cannot avoid being in the sun, wear protective clothing and use sunscreen. Do not use sun lamps or tanning beds/booths. What side effects may I notice from receiving this medicine? Side effects that you should report to your doctor or health care professional as soon as possible:  allergic reactions like skin rash, itching or hives, swelling of the face, lips, or tongue  anxious  breathing problems  changes in vision  confusion  elevated mood, decreased need for sleep, racing thoughts, impulsive behavior  eye pain  fast, irregular heartbeat  feeling faint or lightheaded, falls  feeling agitated, angry, or irritable  fever with increased sweating  hallucination, loss of contact with reality  seizures  stiff muscles  suicidal thoughts or other mood changes  tingling, pain, or numbness in the feet or hands  trouble passing urine or change in the amount of urine  trouble sleeping  unusually weak or tired  vomiting  yellowing of the eyes or skin Side effects that usually do not require medical attention (report to your doctor or health care professional if they continue or are bothersome):  change in sex drive or performance  change in appetite or weight  constipation  dizziness  dry mouth  nausea  tired  tremors  upset stomach This list may not describe all possible side effects. Call your doctor for medical advice about side effects. You may report side effects to FDA at 1-800-FDA-1088. Where should I keep my medicine? Keep out of the reach of children. Store at room temperature between 15 and 30 degrees C (59 and 86 degrees F). Keep container tightly closed. Throw away any  unused medicine after the expiration date. NOTE: This sheet is a summary. It may not cover all possible information. If you have questions about this medicine, talk to  your doctor, pharmacist, or health care provider.  2020 Elsevier/Gold Standard (2018-02-16 13:24:58)

## 2020-03-12 NOTE — Progress Notes (Signed)
[facility]    Provider:  Dr Lucia Gaskins Requesting Provider: [facility] Primary Care Provider:  [facility]  CC:  vertigo  HPI:  [name] is a 40 y.o. female here as requested by [facility] for dizziness. She had Covid in October. She ha always suffered with migraines. Migraines got worse with covid, started pounding, more sever than int he past, she does have severe migraines with pulsating/pounding/throbbing, nausea, movement ment makes it worse, photo/phonophobia, a dark room is the only think that helps. Even creaking of foot steps worsen migraines. She has horrible headaches anyway. But since Covid she feels motion when it is not there, even laying still in bed she had room spinning, this started after having covid. She lost her balance and fell. Movement makes it worse but doesn't necessarily trigger it. She is getting episodes of vertigo every day. It got better but now worsening over the last 2 weeks. Episodes are brief, multiple mini episodes. She saw ENT and everything was fine, hearing is good. She is having more migraines as well. She would get migraines often even before vertigo and covid, she has had frequent migraines. She is not using birth control. She reports the symptoms are worse when supine. Also she is having "white out" of her vision. No other focal neurologic deficits, associated symptoms, inciting events or modifiable factors.  Reviewed notes, labs and imaging from outside physicians, which showed:   Fioricet, flexeril, decadron, benadryl, ketorolac, meclizine, reglan PO and inj, promethazine po, scopolamine, Topiramate,   Cbc/cmp unremarkable 12/25/2019.  Mild leukopenia.  Reviewed emergency room notes from December 25, 2019, she presented to the ED with dizziness intermittently for several weeks, episodes of room spinning dizziness that occurred since she began having Covid symptoms, they last for less than  a minute before complete resolution, increasing in frequency, 5 that day, positive Covid test October 5 no fever for over a week, she did report fatigue intermittent dizziness, cough and some shortness of breath, physical examination and neurological examination were normal, she was given a cocktail of Decadron, Reglan, and fluids, mild leukopenia, patient was sent home.  Review of Systems: Patient complains of symptoms per HPI as well as the following symptoms: headache, dizziness, vertigo. Pertinent negatives and positives per HPI. All others negative.   Social History   Socioeconomic History  . Marital status: Married    Spouse name: Not on file  . Number of children: 3  . Years of education: Not on file  . Highest education level: Associate degree: occupational, Scientist, product/process development, or vocational program  Occupational History  . Not on file  Tobacco Use  . Smoking status: Never Smoker  . Smokeless tobacco: Never Used  Vaping Use  . Vaping Use: Never used  Substance and Sexual Activity  . Alcohol use: No    Alcohol/week: 0.0 standard drinks  . Drug use: No  . Sexual activity: Yes    Partners: Male    Birth control/protection: None  Other Topics Concern  . Not on file  Social History Narrative   Lives at home with her family   Right handed   Caffeine: none    Social Determinants of Health   Financial Resource Strain: Not on file  Food Insecurity: Not on file  Transportation Needs: Not on file  Physical Activity: Not on file  Stress: Not on file  Social Connections: Not on file  Intimate Partner Violence: Not on file    Family History  Problem  Relation Age of Onset  . Diabetes Other   . Cancer Other   . Ovarian cancer Mother   . Lung cancer Mother   . Heart defect Daughter   . Heart disease Maternal Grandmother   . Arthritis Maternal Grandmother   . Cancer Maternal Grandmother   . Diabetes Maternal Grandmother   . Hyperlipidemia Maternal Grandmother   . Depression  Sister   . COPD Maternal Grandfather   . Arthritis Paternal Grandmother     Past Medical History:  Diagnosis Date  . Allergy   . Anemia   . Asthma   . GERD (gastroesophageal reflux disease)   . Headache(784.0)   . Heart murmur     Patient Active Problem List   Diagnosis Date Noted  . Vertigo 03/12/2020  . Chronic migraine without aura without status migrainosus, not intractable 03/12/2020  . S/P cesarean section 02/08/2014  . [redacted] weeks gestation of pregnancy   . Irregular contractions   . Hereditary disease in family possibly affecting fetus, affecting management of mother, antepartum condition or complication   . [redacted] weeks gestation of pregnancy   . Previous cesarean delivery affecting pregnancy   . [redacted] weeks gestation of pregnancy   . Poor fetal growth affecting management of mother in third trimester, antepartum   . Anemia during pregnancy 01/14/2014  . Abdominal pain affecting pregnancy, antepartum   . Non-reactive NST (non-stress test)   . [redacted] weeks gestation of pregnancy   . Vasa previa 12/16/2013  . Abnormal EKG 09/05/2013  . Hyperemesis complicating pregnancy, antepartum 09/05/2013  . Nonspecific abnormal electrocardiogram (ECG) (EKG) 08/13/2013  . Nausea and vomiting in pregnancy prior to [redacted] weeks gestation 08/13/2013    Past Surgical History:  Procedure Laterality Date  . CESAREAN SECTION  02/27/2005  . CESAREAN SECTION N/A 02/08/2014   Procedure: REPEAT  CESAREAN SECTION;  Surgeon: Antionette Char, MD;  Location: [facility];  Service: Obstetrics;  Laterality: N/A;  . CESAREAN SECTION  11/16/2001    Current Outpatient Medications  Medication Sig Dispense Refill  . albuterol (PROVENTIL HFA;VENTOLIN HFA) 108 (90 Base) MCG/ACT inhaler Inhale 1-2 puffs into the lungs every 6 (six) hours as needed for wheezing or shortness of breath. 1 Inhaler 0  . Budesonide-Formoterol Fumarate (SYMBICORT IN) Inhale 2 puffs into the lungs in the morning and at bedtime.    .  chlorhexidine (PERIDEX) 0.12 % solution Use as directed 15 mLs in the mouth or throat 2 (two) times daily.     . fluticasone (FLONASE) 50 MCG/ACT nasal spray Place 1 spray into both nostrils daily.     . methylPREDNISolone (MEDROL DOSEPAK) 4 MG TBPK tablet Take pills together daily with food for 6 days 21 tablet 1  . nortriptyline (PAMELOR) 10 MG capsule Take 1 capsule (10 mg total) by mouth at bedtime. 30 capsule 3  . ondansetron (ZOFRAN-ODT) 4 MG disintegrating tablet Take 1-2 tablets (4-8 mg total) by mouth every 8 (eight) hours as needed for nausea. Or for vertigo 30 tablet 3  . rizatriptan (MAXALT-MLT) 10 MG disintegrating tablet Take 1 tablet (10 mg total) by mouth as needed for migraine. May repeat in 2 hours if needed 9 tablet 11  . SODIUM FLUORIDE 5000 PPM 1.1 % PSTE Take 1 application by mouth daily.      No current facility-administered medications for this visit.    Allergies as of 03/12/2020 - Review Complete 03/12/2020  Allergen Reaction Noted  . Shrimp [shellfish allergy] Anaphylaxis 01/18/2011    Vitals: BP 111/69 (  BP Location: Left Arm, Patient Position: Sitting)   Pulse 75   Ht 5' 8.5" (1.74 m)   Wt 157 lb (71.2 kg)   LMP 03/05/2020 (Approximate)   BMI 23.52 kg/m  Last Weight:  Wt Readings from Last 1 Encounters:  03/12/20 157 lb (71.2 kg)   Last Height:   Ht Readings from Last 1 Encounters:  03/12/20 5' 8.5" (1.74 m)     Physical exam: Exam: Gen: NAD, conversant, well nourised, well groomed                     CV: RRR, no MRG. No Carotid Bruits. No peripheral edema, warm, nontender Eyes: Conjunctivae clear without exudates or hemorrhage  Neuro: Detailed Neurologic Exam  Speech:    Speech is normal; fluent and spontaneous with normal comprehension.  Cognition:    The patient is oriented to person, place, and time;     recent and remote memory intact;     language fluent;     normal attention, concentration,     fund of knowledge Cranial Nerves:     The pupils are equal, round, and reactive to light. Attempted, could not visualize fundi due to photophoia Visual fields are full to finger confrontation. Extraocular movements are intact. Trigeminal sensation is intact and the muscles of mastication are normal. The face is symmetric. The palate elevates in the midline. Hearing intact. Voice is normal. Shoulder shrug is normal. The tongue has normal motion without fasciculations.   Coordination:    No dysmetria or ataxia.   Gait:    Normal native gait  Motor Observation:    No asymmetry, no atrophy, and no involuntary movements noted. Tone:    Normal muscle tone.    Posture:    Posture is normal. normal erect    Strength:    Strength is V/V in the upper and lower limbs.      Sensation: intact to LT     Reflex Exam:  DTR's:    Deep tendon reflexes in the upper and lower extremities are normal bilaterally.   Toes:    The toes are downgoing bilaterally.   Clonus:    Clonus is absent.    Assessment/Plan: This is a 40 year old with chronic migraines however they have been worsening since patient had Covid with new concerning symptoms including vision loss, positional headaches, worsening headaches, vertigo and dizziness, lightheadedness, intractable.  This may be worsening of her migraine syndromes after Covid with new vestibular symptoms, this could be a labyrinthitis or other inner ear ear issue (however she saw ENT who stated exam was unremarkable), but we really need to rule out strokes, demyelination, compressive mass, idiopathic intracranial hypertension, multiple sclerosis, structural problems such as Chiari malformation or any other intracranial issues that could be causing this.  Patient is currently not pregnant however she is not using birth control and so we discussed teratogenicity, I will prescribe her medications that are thought to be compatible with pregnancy however nothing is entirely safe, I recommended she start using  birth control in the interim until she gets her MRI and her symptoms are under control, if she does get pregnant stop using nortriptyline and Maxalt and Zofran until we discussed in detail.  MRI of the brain as above Nortriptyline at bedtime: for migraine prevention Medrol dosepak 6 days: to help with current symptoms Maxalt: Acutely for dizziness and vertigo and migraine: Please take one tablet at the onset of your headache. If it does not improve the  symptoms please take one additional tablet. Do not take more then 2 tablets in 24hrs. Do not take use more then 2 to 3 times in a week. Ondansetron: for nausea or vertigo Also provided documentation for her to read on migraine with associated vertigo, migraine with aura, and migraine patient guidelines with information on migraines, migraine treatments and other related issues with migraines.  Discussed: To prevent or relieve headaches, try the following: Cool Compress. Lie down and place a cool compress on your head.  Avoid headache triggers. If certain foods or odors seem to have triggered your migraines in the past, avoid them. A headache diary might help you identify triggers.  Include physical activity in your daily routine. Try a daily walk or other moderate aerobic exercise.  Manage stress. Find healthy ways to cope with the stressors, such as delegating tasks on your to-do list.  Practice relaxation techniques. Try deep breathing, yoga, massage and visualization.  Eat regularly. Eating regularly scheduled meals and maintaining a healthy diet might help prevent headaches. Also, drink plenty of fluids.  Follow a regular sleep schedule. Sleep deprivation might contribute to headaches Consider biofeedback. With this mind-body technique, you learn to control certain bodily functions -- such as muscle tension, heart rate and blood pressure -- to prevent headaches or reduce headache pain.    Proceed to emergency room if you experience new or  worsening symptoms or symptoms do not resolve, if you have new neurologic symptoms or if headache is severe, or for any concerning symptom.   Provided education and documentation from American headache Society toolbox including articles on: chronic migraine medication overuse headache, chronic migraines, prevention of migraines, behavioral and other nonpharmacologic treatments for headache.  Orders Placed This Encounter  Procedures  . MR BRAIN W WO CONTRAST  . TSH  . Comprehensive metabolic panel  . CBC with Differential/Platelets   Meds ordered this encounter  Medications  . nortriptyline (PAMELOR) 10 MG capsule    Sig: Take 1 capsule (10 mg total) by mouth at bedtime.    Dispense:  30 capsule    Refill:  3  . rizatriptan (MAXALT-MLT) 10 MG disintegrating tablet    Sig: Take 1 tablet (10 mg total) by mouth as needed for migraine. May repeat in 2 hours if needed    Dispense:  9 tablet    Refill:  11  . methylPREDNISolone (MEDROL DOSEPAK) 4 MG TBPK tablet    Sig: Take pills together daily with food for 6 days    Dispense:  21 tablet    Refill:  1  . ondansetron (ZOFRAN-ODT) 4 MG disintegrating tablet    Sig: Take 1-2 tablets (4-8 mg total) by mouth every 8 (eight) hours as needed for nausea. Or for vertigo    Dispense:  30 tablet    Refill:  3  . ketorolac (TORADOL) injection 60 mg    Cc: [facility]  [facility]  Sarina Ill, MD  [facility] [address] [place], [place] [address]  Phone [phone] Fax [phone]

## 2020-03-24 ENCOUNTER — Other Ambulatory Visit: Payer: Self-pay

## 2020-03-24 ENCOUNTER — Ambulatory Visit
Admission: RE | Admit: 2020-03-24 | Discharge: 2020-03-24 | Disposition: A | Discharge status: Home / Self Care | Payer: Medicaid Other | Source: Ambulatory Visit | Attending: Neurology | Admitting: Neurology

## 2020-03-24 DIAGNOSIS — H547 Unspecified visual loss: Secondary | ICD-10-CM

## 2020-03-24 DIAGNOSIS — R519 Headache, unspecified: Secondary | ICD-10-CM

## 2020-03-24 DIAGNOSIS — R51 Headache with orthostatic component, not elsewhere classified: Secondary | ICD-10-CM

## 2020-03-24 DIAGNOSIS — R42 Dizziness and giddiness: Secondary | ICD-10-CM

## 2020-03-24 MED ORDER — GADOBENATE DIMEGLUMINE 529 MG/ML IV SOLN
15.0000 mL | Freq: Once | INTRAVENOUS | Status: AC | PRN
  Administered 2020-03-24: 15 mL via INTRAVENOUS

## 2020-03-28 ENCOUNTER — Telehealth: Payer: Self-pay | Admitting: *Deleted

## 2020-03-28 NOTE — Telephone Encounter (Signed)
Spoke with patient and let her know the MRI brain was unremarkable, essentially normal for age, no concerns. Pt verbalized understanding and appreciation. Her questions were answered.

## 2020-03-28 NOTE — Telephone Encounter (Signed)
-----   Message from Anson Fret, MD sent at 03/25/2020 11:15 AM EST ----- MRI of the brain unremarkable, essentially normal for age thanks

## 2020-04-25 ENCOUNTER — Other Ambulatory Visit: Payer: Self-pay | Admitting: Internal Medicine

## 2020-04-26 LAB — CBC
HCT: 37.6 % (ref 35.0–45.0)
Hemoglobin: 12.1 g/dL (ref 11.7–15.5)
MCH: 29.2 pg (ref 27.0–33.0)
MCHC: 32.2 g/dL (ref 32.0–36.0)
MCV: 90.8 fL (ref 80.0–100.0)
Platelets: 148 10*3/uL (ref 140–400)
RBC: 4.14 10*6/uL (ref 3.80–5.10)
RDW: 13.1 % (ref 11.0–15.0)
WBC: 4.1 10*3/uL (ref 3.8–10.8)

## 2020-04-26 LAB — LIPID PANEL
Cholesterol: 164 mg/dL (ref <200)
HDL: 73 mg/dL (ref >=50)
LDL Cholesterol (Calc): 77 mg/dL (calc)
Non-HDL Cholesterol (Calc): 91 mg/dL (calc) (ref <130)
Total CHOL/HDL Ratio: 2.2 (calc) (ref <5.0)
Triglycerides: 48 mg/dL (ref <150)

## 2020-04-26 LAB — COMPLETE METABOLIC PANEL WITH GFR
AG Ratio: 1.4 (calc) (ref 1.0–2.5)
ALT: 16 U/L (ref 6–29)
AST: 20 U/L (ref 10–30)
Albumin: 4.1 g/dL (ref 3.6–5.1)
Alkaline phosphatase (APISO): 48 U/L (ref 31–125)
BUN: 10 mg/dL (ref 7–25)
CO2: 25 mmol/L (ref 20–32)
Calcium: 9.3 mg/dL (ref 8.6–10.2)
Chloride: 106 mmol/L (ref 98–110)
Creat: 0.8 mg/dL (ref 0.50–1.10)
GFR, Est African American: 109 mL/min/{1.73_m2} (ref >=60)
GFR, Est Non African American: 94 mL/min/{1.73_m2} (ref >=60)
Globulin: 3 g/dL (calc) (ref 1.9–3.7)
Glucose, Bld: 86 mg/dL (ref 65–99)
Potassium: 4.6 mmol/L (ref 3.5–5.3)
Sodium: 139 mmol/L (ref 135–146)
Total Bilirubin: 0.7 mg/dL (ref 0.2–1.2)
Total Protein: 7.1 g/dL (ref 6.1–8.1)

## 2020-04-26 LAB — VITAMIN D 25 HYDROXY (VIT D DEFICIENCY, FRACTURES): Vit D, 25-Hydroxy: 23 ng/mL — ABNORMAL LOW (ref 30–100)

## 2020-06-11 ENCOUNTER — Encounter: Payer: Self-pay | Admitting: Family Medicine

## 2020-06-11 ENCOUNTER — Ambulatory Visit: Payer: Medicaid Other | Admitting: Family Medicine

## 2020-06-11 VITALS — BP 106/73 | HR 64 | Ht 68.5 in | Wt 157.0 lb

## 2020-06-11 DIAGNOSIS — G43809 Other migraine, not intractable, without status migrainosus: Secondary | ICD-10-CM

## 2020-06-11 DIAGNOSIS — R42 Dizziness and giddiness: Secondary | ICD-10-CM | POA: Diagnosis not present

## 2020-06-11 DIAGNOSIS — G43709 Chronic migraine without aura, not intractable, without status migrainosus: Secondary | ICD-10-CM | POA: Diagnosis not present

## 2020-06-11 MED ORDER — NORTRIPTYLINE HCL 10 MG PO CAPS: 10.0000 mg | ORAL_CAPSULE | Freq: Every day | ORAL | 3 refills | Status: DC

## 2020-06-11 MED ORDER — ONDANSETRON 4 MG PO TBDP: 4.0000 mg | ORAL_TABLET | Freq: Three times a day (TID) | ORAL | 3 refills | Status: DC | PRN

## 2020-06-11 MED ORDER — RIZATRIPTAN BENZOATE 10 MG PO TBDP: 10.0000 mg | ORAL_TABLET | ORAL | 11 refills | Status: DC | PRN

## 2020-06-11 NOTE — Progress Notes (Addendum)
Chief Complaint  Patient presents with  . Follow-up    Vertigo; patient denies any further dizzy spells and also reports her migraines are better. She can go 2 weeks without one now and feels she is doing way better than before. Migraines are also not as severe as they used to be.   . Room 2    Here alone      HISTORY OF PRESENT ILLNESS: 06/11/20 ALL:  [name] is a 40 y.o. female here today for follow up for migraines. She was started on nortriptyline and rizatriptan in 03/2020. MRI unremarkable. She reports that she was told she did not have any refills and weaned nortriptyline. She has been off of this for about a month. She reports that she has not had any dizzy spells since last being seen. Headaches are less frequent and less severe. She does not know if rizatritpan helped much in the beginning and has not needed it recently.   She lost her mother, recently. She was the oldest child and oldest grandchild. She has taken on most of the responsibility of caring for her grandparents. She is married and has three children.     HISTORY (copied from Dr Trevor Mace previous note)  HPI:  [name] is a 40 y.o. female here as requested by [facility] for dizziness. She had Covid in October. She ha always suffered with migraines. Migraines got worse with covid, started pounding, more sever than int he past, she does have severe migraines with pulsating/pounding/throbbing, nausea, movement ment makes it worse, photo/phonophobia, a dark room is the only think that helps. Even creaking of foot steps worsen migraines. She has horrible headaches anyway. But since Covid she feels motion when it is not there, even laying still in bed she had room spinning, this started after having covid. She lost her balance and fell. Movement makes it worse but doesn't necessarily trigger it. She is getting episodes of vertigo every day. It got better but now worsening over the last 2 weeks.  Episodes are brief, multiple mini episodes. She saw ENT and everything was fine, hearing is good. She is having more migraines as well. She would get migraines often even before vertigo and covid, she has had frequent migraines. She is not using birth control. She reports the symptoms are worse when supine. Also she is having "white out" of her vision. No other focal neurologic deficits, associated symptoms, inciting events or modifiable factors.  Reviewed notes, labs and imaging from outside physicians, which showed:   Fioricet, flexeril, decadron, benadryl, ketorolac, meclizine, reglan PO and inj, promethazine po, scopolamine, Topiramate,   Cbc/cmp unremarkable 12/25/2019.  Mild leukopenia.  Reviewed emergency room notes from December 25, 2019, she presented to the ED with dizziness intermittently for several weeks, episodes of room spinning dizziness that occurred since she began having Covid symptoms, they last for less than a minute before complete resolution, increasing in frequency, 5 that day, positive Covid test October 5 no fever for over a week, she did report fatigue intermittent dizziness, cough and some shortness of breath, physical examination and neurological examination were normal, she was given a cocktail of Decadron, Reglan, and fluids, mild leukopenia, patient was sent home.   REVIEW OF SYSTEMS: Out of a complete 14 system review of symptoms, the patient complains only of the following symptoms, headaches, stress and all other reviewed systems are negative.    ALLERGIES: Allergies  Allergen Reactions  . Shrimp [Shellfish Allergy] Anaphylaxis  HOME MEDICATIONS: Outpatient Medications Prior to Visit  Medication Sig Dispense Refill  . albuterol (PROVENTIL HFA;VENTOLIN HFA) 108 (90 Base) MCG/ACT inhaler Inhale 1-2 puffs into the lungs every 6 (six) hours as needed for wheezing or shortness of breath. 1 Inhaler 0  . Budesonide-Formoterol Fumarate (SYMBICORT IN) Inhale  2 puffs into the lungs in the morning and at bedtime.    . chlorhexidine (PERIDEX) 0.12 % solution Use as directed 15 mLs in the mouth or throat 2 (two) times daily.     . fluticasone (FLONASE) 50 MCG/ACT nasal spray Place 1 spray into both nostrils daily.     . ondansetron (ZOFRAN-ODT) 4 MG disintegrating tablet Take 1-2 tablets (4-8 mg total) by mouth every 8 (eight) hours as needed for nausea. Or for vertigo 30 tablet 3  . rizatriptan (MAXALT-MLT) 10 MG disintegrating tablet Take 1 tablet (10 mg total) by mouth as needed for migraine. May repeat in 2 hours if needed 9 tablet 11  . SODIUM FLUORIDE 5000 PPM 1.1 % PSTE Take 1 application by mouth daily.     . methylPREDNISolone (MEDROL DOSEPAK) 4 MG TBPK tablet Take pills together daily with food for 6 days (Patient not taking: Reported on 06/11/2020) 21 tablet 1  . nortriptyline (PAMELOR) 10 MG capsule Take 1 capsule (10 mg total) by mouth at bedtime. (Patient not taking: Reported on 06/11/2020) 30 capsule 3   No facility-administered medications prior to visit.     PAST MEDICAL HISTORY: Past Medical History:  Diagnosis Date  . Allergy   . Anemia   . Asthma   . GERD (gastroesophageal reflux disease)   . Headache(784.0)   . Heart murmur      PAST SURGICAL HISTORY: Past Surgical History:  Procedure Laterality Date  . CESAREAN SECTION  02/27/2005  . CESAREAN SECTION N/A 02/08/2014   Procedure: REPEAT  CESAREAN SECTION;  Surgeon: Antionette CharLisa Jackson-Moore, MD;  Location: [facility];  Service: Obstetrics;  Laterality: N/A;  . CESAREAN SECTION  11/16/2001     FAMILY HISTORY: Family History  Problem Relation Age of Onset  . Diabetes Other   . Cancer Other   . Ovarian cancer Mother   . Lung cancer Mother   . Heart defect Daughter   . Heart disease Maternal Grandmother   . Arthritis Maternal Grandmother   . Cancer Maternal Grandmother   . Diabetes Maternal Grandmother   . Hyperlipidemia Maternal Grandmother   . Depression Sister   . COPD  Maternal Grandfather   . Arthritis Paternal Grandmother      SOCIAL HISTORY: Social History   Socioeconomic History  . Marital status: Married    Spouse name: Not on file  . Number of children: 3  . Years of education: Not on file  . Highest education level: Associate degree: occupational, Scientist, product/process developmenttechnical, or vocational program  Occupational History  . Not on file  Tobacco Use  . Smoking status: Never Smoker  . Smokeless tobacco: Never Used  Vaping Use  . Vaping Use: Never used  Substance and Sexual Activity  . Alcohol use: No    Alcohol/week: 0.0 standard drinks  . Drug use: No  . Sexual activity: Yes    Partners: Male    Birth control/protection: None  Other Topics Concern  . Not on file  Social History Narrative   Lives at home with her family   Right handed   Caffeine: none    Social Determinants of Health   Financial Resource Strain: Not on file  Food Insecurity:  Not on file  Transportation Needs: Not on file  Physical Activity: Not on file  Stress: Not on file  Social Connections: Not on file  Intimate Partner Violence: Not on file      PHYSICAL EXAM  Vitals:   06/11/20 1400  BP: 106/73  Pulse: 64  Weight: 157 lb (71.2 kg)  Height: 5' 8.5" (1.74 m)   Body mass index is 23.52 kg/m.   Generalized: Well developed, in no acute distress  Cardiology: normal rate and rhythm, no murmur auscultated  Respiratory: clear to auscultation bilaterally    Neurological examination  Mentation: Alert oriented to time, place, history taking. Follows all commands speech and language fluent Cranial nerve II-XII: Pupils were equal round reactive to light. Extraocular movements were full, visual field were full on confrontational test. Facial sensation and strength were normal. Head turning and shoulder shrug  were normal and symmetric. Motor: The motor testing reveals 5 over 5 strength of all 4 extremities. Good symmetric motor tone is noted throughout.  Gait and  station: Gait is normal.     DIAGNOSTIC DATA (LABS, IMAGING, TESTING) - I reviewed patient records, labs, notes, testing and imaging myself where available.  Lab Results  Component Value Date   WBC 4.1 04/25/2020   HGB 12.1 04/25/2020   HCT 37.6 04/25/2020   MCV 90.8 04/25/2020   PLT 148 04/25/2020      Component Value Date/Time   NA 139 04/25/2020 1336   K 4.6 04/25/2020 1336   CL 106 04/25/2020 1336   CO2 25 04/25/2020 1336   GLUCOSE 86 04/25/2020 1336   BUN 10 04/25/2020 1336   CREATININE 0.80 04/25/2020 1336   CALCIUM 9.3 04/25/2020 1336   PROT 7.1 04/25/2020 1336   ALBUMIN 4.0 12/25/2019 1745   AST 20 04/25/2020 1336   ALT 16 04/25/2020 1336   ALKPHOS 40 12/25/2019 1745   BILITOT 0.7 04/25/2020 1336   GFRNONAA 94 04/25/2020 1336   GFRAA 109 04/25/2020 1336   Lab Results  Component Value Date   CHOL 164 04/25/2020   HDL 73 04/25/2020   LDLCALC 77 04/25/2020   TRIG 48 04/25/2020   CHOLHDL 2.2 04/25/2020   No results found for: HGBA1C No results found for: VITAMINB12 Lab Results  Component Value Date   TSH 1.061 12/20/2014    No flowsheet data found.   No flowsheet data found.   ASSESSMENT AND PLAN  40 y.o. year old female  has a past medical history of Allergy, Anemia, Asthma, GERD (gastroesophageal reflux disease), Headache(784.0), and Heart murmur. here with   Chronic migraine without aura without status migrainosus, not intractable  [name] is doing well, today. She feels that headaches have improved significantly and dizzy spells have resolved after starting nortriptyline. She feels it may be helping with anxiety as well. We will resume nortriptyline 10mg  at bedtime. I have updated refills for 1 year. She will continue rizatriptan and ondansetron as needed for migraine abortion. She will continue to focus on healthy lifestyle habits. She will follow up with me in 6-12 months, sooner if needed.   No orders of the defined types were placed in this  encounter.    No orders of the defined types were placed in this encounter.     I spent 20 minutes of face-to-face and non-face-to-face time with patient.  This included previsit chart review, lab review, study review, order entry, electronic health record documentation, patient education.    8-12, MSN, FNP-C 06/11/2020, 2:07 PM  [facility] [address] [place], [place] [address] [phone]   Made any corrections needed, and agree with history, physical, neuro exam,assessment and plan as stated.     Naomie Dean, MD [facility]

## 2020-06-11 NOTE — Patient Instructions (Signed)
Below is our plan:  We will restart nortriptyline 10mg  at bedtime. Continue rizatriptan and ondansetron as needed for abortive therapy.   Please make sure you are staying well hydrated. I recommend 50-60 ounces daily. Well balanced diet and regular exercise encouraged. Consistent sleep schedule with 6-8 hours recommended.   Please continue follow up with care team as directed.   Follow up with me in 6-12 months   You may receive a survey regarding today's visit. I encourage you to leave honest feed back as I do use this information to improve patient care. Thank you for seeing me today!      Migraine Headache A migraine headache is a very strong throbbing pain on one side or both sides of your head. This type of headache can also cause other symptoms. It can last from 4 hours to 3 days. Talk with your doctor about what things may bring on (trigger) this condition. What are the causes? The exact cause of this condition is not known. This condition may be triggered or caused by:  Drinking alcohol.  Smoking.  Taking medicines, such as: ? Medicine used to treat chest pain (nitroglycerin). ? Birth control pills. ? Estrogen. ? Some blood pressure medicines.  Eating or drinking certain products.  Doing physical activity. Other things that may trigger a migraine headache include:  Having a menstrual period.  Pregnancy.  Hunger.  Stress.  Not getting enough sleep or getting too much sleep.  Weather changes.  Tiredness (fatigue). What increases the risk?  Being 44-2 years old.  Being female.  Having a family history of migraine headaches.  Being Caucasian.  Having depression or anxiety.  Being very overweight. What are the signs or symptoms?  A throbbing pain. This pain may: ? Happen in any area of the head, such as on one side or both sides. ? Make it hard to do daily activities. ? Get worse with physical activity. ? Get worse around bright lights or loud  noises.  Other symptoms may include: ? Feeling sick to your stomach (nauseous). ? Vomiting. ? Dizziness. ? Being sensitive to bright lights, loud noises, or smells.  Before you get a migraine headache, you may get warning signs (an aura). An aura may include: ? Seeing flashing lights or having blind spots. ? Seeing bright spots, halos, or zigzag lines. ? Having tunnel vision or blurred vision. ? Having numbness or a tingling feeling. ? Having trouble talking. ? Having weak muscles.  Some people have symptoms after a migraine headache (postdromal phase), such as: ? Tiredness. ? Trouble thinking (concentrating). How is this treated?  Taking medicines that: ? Relieve pain. ? Relieve the feeling of being sick to your stomach. ? Prevent migraine headaches.  Treatment may also include: ? Having acupuncture. ? Avoiding foods that bring on migraine headaches. ? Learning ways to control your body functions (biofeedback). ? Therapy to help you know and deal with negative thoughts (cognitive behavioral therapy). Follow these instructions at home: Medicines  Take over-the-counter and prescription medicines only as told by your doctor.  Ask your doctor if the medicine prescribed to you: ? Requires you to avoid driving or using heavy machinery. ? Can cause trouble pooping (constipation). You may need to take these steps to prevent or treat trouble pooping:  Drink enough fluid to keep your pee (urine) pale yellow.  Take over-the-counter or prescription medicines.  Eat foods that are high in fiber. These include beans, whole grains, and fresh fruits and vegetables.  Limit  foods that are high in fat and sugar. These include fried or sweet foods. Lifestyle  Do not drink alcohol.  Do not use any products that contain nicotine or tobacco, such as cigarettes, e-cigarettes, and chewing tobacco. If you need help quitting, ask your doctor.  Get at least 8 hours of sleep every  night.  Limit and deal with stress. General instructions  Keep a journal to find out what may bring on your migraine headaches. For example, write down: ? What you eat and drink. ? How much sleep you get. ? Any change in what you eat or drink. ? Any change in your medicines.  If you have a migraine headache: ? Avoid things that make your symptoms worse, such as bright lights. ? It may help to lie down in a dark, quiet room. ? Do not drive or use heavy machinery. ? Ask your doctor what activities are safe for you.  Keep all follow-up visits as told by your doctor. This is important.      Contact a doctor if:  You get a migraine headache that is different or worse than others you have had.  You have more than 15 headache days in one month. Get help right away if:  Your migraine headache gets very bad.  Your migraine headache lasts longer than 72 hours.  You have a fever.  You have a stiff neck.  You have trouble seeing.  Your muscles feel weak or like you cannot control them.  You start to lose your balance a lot.  You start to have trouble walking.  You pass out (faint).  You have a seizure. Summary  A migraine headache is a very strong throbbing pain on one side or both sides of your head. These headaches can also cause other symptoms.  This condition may be treated with medicines and changes to your lifestyle.  Keep a journal to find out what may bring on your migraine headaches.  Contact a doctor if you get a migraine headache that is different or worse than others you have had.  Contact your doctor if you have more than 15 headache days in a month. This information is not intended to replace advice given to you by your health care provider. Make sure you discuss any questions you have with your health care provider. Document Revised: 06/18/2018 Document Reviewed: 04/08/2018 Elsevier Patient Education  2021 [employer].

## 2020-09-05 ENCOUNTER — Ambulatory Visit: Payer: Medicaid Other | Admitting: Obstetrics

## 2020-09-24 ENCOUNTER — Encounter: Payer: Self-pay | Admitting: Obstetrics

## 2020-09-24 ENCOUNTER — Other Ambulatory Visit: Payer: Self-pay

## 2020-09-24 ENCOUNTER — Other Ambulatory Visit (HOSPITAL_COMMUNITY)
Admission: RE | Admit: 2020-09-24 | Discharge: 2020-09-24 | Disposition: A | Discharge status: Home / Self Care | Payer: Medicaid Other | Source: Ambulatory Visit | Attending: Obstetrics | Admitting: Obstetrics

## 2020-09-24 ENCOUNTER — Ambulatory Visit (INDEPENDENT_AMBULATORY_CARE_PROVIDER_SITE_OTHER): Payer: Medicaid Other | Admitting: Obstetrics

## 2020-09-24 VITALS — BP 116/83 | HR 56 | Ht 68.0 in | Wt 152.0 lb

## 2020-09-24 DIAGNOSIS — N898 Other specified noninflammatory disorders of vagina: Secondary | ICD-10-CM | POA: Insufficient documentation

## 2020-09-24 DIAGNOSIS — L309 Dermatitis, unspecified: Secondary | ICD-10-CM

## 2020-09-24 DIAGNOSIS — Z1501 Genetic susceptibility to malignant neoplasm of breast: Secondary | ICD-10-CM

## 2020-09-24 DIAGNOSIS — Z01419 Encounter for gynecological examination (general) (routine) without abnormal findings: Secondary | ICD-10-CM

## 2020-09-24 DIAGNOSIS — D508 Other iron deficiency anemias: Secondary | ICD-10-CM | POA: Diagnosis not present

## 2020-09-24 DIAGNOSIS — Z113 Encounter for screening for infections with a predominantly sexual mode of transmission: Secondary | ICD-10-CM

## 2020-09-24 DIAGNOSIS — K5909 Other constipation: Secondary | ICD-10-CM

## 2020-09-24 NOTE — Progress Notes (Signed)
Subjective:        [name] is a 40 y.o. female here for a routine exam.  Current complaints: Vaginal discharge.    Personal health questionnaire:  Is patient [name], have a family history of breast and/or ovarian cancer: yes Is there a family history of uterine cancer diagnosed at age < 13, gastrointestinal cancer, urinary tract cancer, family member who is a Personnel officer syndrome-associated carrier: no Is the patient overweight and hypertensive, family history of diabetes, personal history of gestational diabetes, preeclampsia or PCOS: no Is patient over 35, have PCOS,  family history of premature CHD under age 52, diabetes, smoke, have hypertension or peripheral artery disease:  no At any time, has a partner hit, kicked or otherwise hurt or frightened you?: no Over the past 2 weeks, have you felt down, depressed or hopeless?: no Over the past 2 weeks, have you felt little interest or pleasure in doing things?:no   Gynecologic History No LMP recorded. Contraception: none Last Pap: 2019. Results were: normal Last mammogram: n/a. Results were: n/a  Obstetric History OB History  Gravida Para Term Preterm AB Living  4 3 2 1 1 3   SAB IAB Ectopic Multiple Live Births    1   0 3    # Outcome Date GA Lbr Len/2nd Weight Sex Delivery Anes PTL Lv  4 Preterm 02/08/14 [redacted]w[redacted]d  5 lb 12.8 oz (2.63 kg) M CS-LTranv Spinal  LIV  3 Term 02/27/05    M CS-LTranv   LIV  2 Term 11/16/01    F CS-LTranv   LIV  1 IAB             Past Medical History:  Diagnosis Date   Allergy    Anemia    Asthma    GERD (gastroesophageal reflux disease)    Headache(784.0)    Heart murmur     Past Surgical History:  Procedure Laterality Date   CESAREAN SECTION  02/27/2005   CESAREAN SECTION N/A 02/08/2014   Procedure: REPEAT  CESAREAN SECTION;  Surgeon: 14/04/2013, MD;  Location: [facility];  Service: Obstetrics;  Laterality: N/A;   CESAREAN SECTION  11/16/2001     Current Outpatient  Medications:    albuterol (PROVENTIL HFA;VENTOLIN HFA) 108 (90 Base) MCG/ACT inhaler, Inhale 1-2 puffs into the lungs every 6 (six) hours as needed for wheezing or shortness of breath., Disp: 1 Inhaler, Rfl: 0   Budesonide-Formoterol Fumarate (SYMBICORT IN), Inhale 2 puffs into the lungs in the morning and at bedtime., Disp: , Rfl:    fluticasone (FLONASE) 50 MCG/ACT nasal spray, Place 1 spray into both nostrils daily. , Disp: , Rfl:    nortriptyline (PAMELOR) 10 MG capsule, Take 1 capsule (10 mg total) by mouth at bedtime., Disp: 90 capsule, Rfl: 3   rizatriptan (MAXALT-MLT) 10 MG disintegrating tablet, Take 1 tablet (10 mg total) by mouth as needed for migraine. May repeat in 2 hours if needed, Disp: 9 tablet, Rfl: 11   chlorhexidine (PERIDEX) 0.12 % solution, Use as directed 15 mLs in the mouth or throat 2 (two) times daily.  (Patient not taking: Reported on 09/24/2020), Disp: , Rfl:    ondansetron (ZOFRAN-ODT) 4 MG disintegrating tablet, Take 1-2 tablets (4-8 mg total) by mouth every 8 (eight) hours as needed for nausea. Or for vertigo (Patient not taking: Reported on 09/24/2020), Disp: 30 tablet, Rfl: 3   SODIUM FLUORIDE 5000 PPM 1.1 % PSTE, Take 1 application by mouth daily. , Disp: , Rfl:  Allergies  Allergen Reactions   Shrimp [Shellfish Allergy] Anaphylaxis    Social History   Tobacco Use   Smoking status: Never   Smokeless tobacco: Never  Substance Use Topics   Alcohol use: No    Alcohol/week: 0.0 standard drinks    Family History  Problem Relation Age of Onset   Ovarian cancer Mother    Lung cancer Mother    Depression Sister    Heart disease Maternal Grandmother    Arthritis Maternal Grandmother    Cancer Maternal Grandmother    Diabetes Maternal Grandmother    Hyperlipidemia Maternal Grandmother    COPD Maternal Grandfather    Arthritis Paternal Grandmother    Seizures Daughter    Heart defect Daughter    Diabetes Other    Cancer Other       Review of  Systems  Constitutional: negative for fatigue and weight loss Respiratory: negative for cough and wheezing Cardiovascular: negative for chest pain, fatigue and palpitations Gastrointestinal: negative for abdominal pain and change in bowel habits Musculoskeletal:negative for myalgias Neurological: negative for gait problems and tremors Behavioral/Psych: negative for abusive relationship, depression Endocrine: negative for temperature intolerance    Genitourinary:positive for vaginal discharge.  negative for abnormal menstrual periods, genital lesions, hot flashes, sexual problems  Integument/breast: negative for breast lump, breast tenderness, nipple discharge and skin lesion(s)    Objective:       BP 116/83   Pulse (!) 56   Ht 5\' 8"  (1.727 m)   Wt 152 lb (68.9 kg)   BMI 23.11 kg/m  General:   Alert and no distress  Skin:   no rash or abnormalities  Lungs:   clear to auscultation bilaterally  Heart:   regular rate and rhythm, S1, S2 normal, no murmur, click, rub or gallop  Breasts:   normal without suspicious masses, skin or nipple changes or axillary nodes  Abdomen:  normal findings: no organomegaly, soft, non-tender and no hernia  Pelvis:  External genitalia: normal general appearance Urinary system: urethral meatus normal and bladder without fullness, nontender Vaginal: normal without tenderness, induration or masses Cervix: normal appearance Adnexa: normal bimanual exam Uterus: anteverted and non-tender, normal size   Lab Review Urine pregnancy test Labs reviewed yes Radiologic studies reviewed  I have spent a total of 20 minutes of face-to-face time, excluding clinical staff time, reviewing notes and preparing to see patient, ordering tests and/or medications, and counseling the patient.   Assessment:    .1. Encounter for gynecological examination with Papanicolaou smear of cervix Rx: - Cytology - PAP  2. Vaginal discharge Rx: - Cervicovaginal ancillary only(  [place])  3. Screening for STD (sexually transmitted disease) Rx: - HIV Antibody (routine testing w rflx) - Hepatitis B surface antigen - RPR - Hepatitis C antibody  4. Iron deficiency anemia secondary to inadequate dietary iron intake Rx: - CBC - Ferritin  5. Chronic constipation Rx: - Ambulatory referral to Gastroenterology  6. Dermatitis Rx: - Ambulatory referral to Dermatology  7. Breast cancer genetic susceptibility Rx: - BRCAssure Comprehensive Panel; Future - BRCAssure Comprehensive Panel - MM Digital Screening; Future     Plan:    Education reviewed: calcium supplements, depression evaluation, low fat, low cholesterol diet, safe sex/STD prevention, self breast exams, skin cancer screening, and weight bearing exercise. Mammogram ordered. Follow up in: 1 year.    , MD 09/24/2020 3:57 PM

## 2020-09-24 NOTE — Progress Notes (Signed)
Patient presents for Annual Exam.  LMP: irregular Last pap:02/17/18 WNL  Contraception:none  STD Screening:declined not sexually active. Family Hx of Breast Cancer: P.Aunts x 2   CC: Abdominal pain onset last week.  Pt notes constipation and not going everyday.

## 2020-09-25 LAB — CBC
Hematocrit: 36.3 % (ref 34.0–46.6)
Hemoglobin: 11.4 g/dL (ref 11.1–15.9)
MCH: 28.5 pg (ref 26.6–33.0)
MCHC: 31.4 g/dL — ABNORMAL LOW (ref 31.5–35.7)
MCV: 91 fL (ref 79–97)
Platelets: 142 10*3/uL — ABNORMAL LOW (ref 150–450)
RBC: 4 x10E6/uL (ref 3.77–5.28)
RDW: 14.3 % (ref 11.7–15.4)
WBC: 3.9 10*3/uL (ref 3.4–10.8)

## 2020-09-25 LAB — FERRITIN: Ferritin: 19 ng/mL (ref 15–150)

## 2020-09-25 LAB — HEPATITIS B SURFACE ANTIGEN: Hepatitis B Surface Ag: NEGATIVE

## 2020-09-25 LAB — RPR: RPR Ser Ql: NONREACTIVE

## 2020-09-25 LAB — HIV ANTIBODY (ROUTINE TESTING W REFLEX): HIV Screen 4th Generation wRfx: NONREACTIVE

## 2020-09-25 LAB — HEPATITIS C ANTIBODY: Hep C Virus Ab: 0.2 s/co ratio (ref 0.0–0.9)

## 2020-09-26 LAB — CERVICOVAGINAL ANCILLARY ONLY
Bacterial Vaginitis (gardnerella): POSITIVE — AB
Candida Glabrata: NEGATIVE
Candida Vaginitis: NEGATIVE
Chlamydia: NEGATIVE
Comment: NEGATIVE
Comment: NEGATIVE
Comment: NEGATIVE
Comment: NEGATIVE
Comment: NEGATIVE
Comment: NORMAL
Neisseria Gonorrhea: NEGATIVE
Trichomonas: NEGATIVE

## 2020-09-27 LAB — CYTOLOGY - PAP
Comment: NEGATIVE
Diagnosis: NEGATIVE
High risk HPV: NEGATIVE

## 2020-10-01 ENCOUNTER — Other Ambulatory Visit: Payer: Self-pay | Admitting: Obstetrics

## 2020-10-01 DIAGNOSIS — N760 Acute vaginitis: Secondary | ICD-10-CM

## 2020-10-01 MED ORDER — METRONIDAZOLE 500 MG PO TABS: 500.0000 mg | ORAL_TABLET | Freq: Two times a day (BID) | ORAL | 2 refills | Status: DC

## 2020-10-09 LAB — BRCASSURE COMPREHENSIVE PANEL

## 2020-11-22 ENCOUNTER — Ambulatory Visit (HOSPITAL_COMMUNITY)
Admission: EM | Admit: 2020-11-22 | Discharge: 2020-11-22 | Disposition: A | Discharge status: Home / Self Care | Payer: Medicaid Other | Attending: Family Medicine | Admitting: Family Medicine

## 2020-11-22 ENCOUNTER — Other Ambulatory Visit: Payer: Self-pay

## 2020-11-22 ENCOUNTER — Encounter (HOSPITAL_COMMUNITY): Payer: Self-pay

## 2020-11-22 DIAGNOSIS — M25511 Pain in right shoulder: Secondary | ICD-10-CM | POA: Diagnosis not present

## 2020-11-22 DIAGNOSIS — R0789 Other chest pain: Secondary | ICD-10-CM

## 2020-11-22 MED ORDER — NAPROXEN 500 MG PO TABS: 500.0000 mg | ORAL_TABLET | Freq: Two times a day (BID) | ORAL | 0 refills | Status: DC | PRN

## 2020-11-22 MED ORDER — CYCLOBENZAPRINE HCL 5 MG PO TABS
5.0000 mg | ORAL_TABLET | Freq: Three times a day (TID) | ORAL | 0 refills | Status: DC | PRN
Start: 2020-11-22 — End: 2021-02-21

## 2020-11-22 NOTE — ED Triage Notes (Addendum)
Pt presents with complaints of right shoulder and right chest heaviness. Reports this morning she had off and on sharp stabbing chest pain that has subsided at this time. Pt also endorses right sided weakness in her arm, leg and eye on the right side that started this morning when she woke  up.

## 2020-11-22 NOTE — ED Provider Notes (Signed)
[facility]    CSN: [id number] Arrival date & time: 11/22/20  0840      History   Chief Complaint Chief Complaint  Patient presents with   Shoulder Pain    HPI [name] is a 40 y.o. female.   Patient presenting today with sudden onset right shoulder, right lateral chest heaviness, soreness, spasms that began upon waking this morning.  She states the pain is intermittent, sharp and stabbing lasting seconds at a time.  She denies associated shortness of breath, palpitations, dizziness, headaches, injury, abdominal pain, nausea vomiting or diarrhea, leg swelling, recent long travel, hormone therapies, hx of coag disorders or DVT/PE.  She is a nonsmoker, does have a hx of asthma fairly well controlled on inhaler regimen. Has not had anything like this happen in the past her.  Not try anything over-the-counter for symptoms thus far.     Past Medical History:  Diagnosis Date   Allergy    Anemia    Asthma    GERD (gastroesophageal reflux disease)    Headache(784.0)    Heart murmur     Patient Active Problem List   Diagnosis Date Noted   Vertigo 03/12/2020   Chronic migraine without aura without status migrainosus, not intractable 03/12/2020   S/P cesarean section 02/08/2014   [redacted] weeks gestation of pregnancy    Irregular contractions    Hereditary disease in family possibly affecting fetus, affecting management of mother, antepartum condition or complication    [redacted] weeks gestation of pregnancy    Previous cesarean delivery affecting pregnancy    [redacted] weeks gestation of pregnancy    Poor fetal growth affecting management of mother in third trimester, antepartum    Anemia during pregnancy 01/14/2014   Abdominal pain affecting pregnancy, antepartum    Non-reactive NST (non-stress test)    [redacted] weeks gestation of pregnancy    Vasa previa 12/16/2013   Abnormal EKG 09/05/2013   Hyperemesis complicating pregnancy, antepartum 09/05/2013   Nonspecific abnormal  electrocardiogram (ECG) (EKG) 08/13/2013   Nausea and vomiting in pregnancy prior to [redacted] weeks gestation 08/13/2013    Past Surgical History:  Procedure Laterality Date   CESAREAN SECTION  02/27/2005   CESAREAN SECTION N/A 02/08/2014   Procedure: REPEAT  CESAREAN SECTION;  Surgeon: Antionette Char, MD;  Location: [facility];  Service: Obstetrics;  Laterality: N/A;   CESAREAN SECTION  11/16/2001    OB History     Gravida  4   Para  3   Term  2   Preterm  1   AB  1   Living  3      SAB      IAB  1   Ectopic      Multiple  0   Live Births  3            Home Medications    Prior to Admission medications   Medication Sig Start Date End Date Taking? Authorizing Provider  cyclobenzaprine (FLEXERIL) 5 MG tablet Take 1 tablet (5 mg total) by mouth 3 (three) times daily as needed for muscle spasms. Do not drink alcohol or drive while taking this medication.  May cause drowsiness. 11/22/20  Yes Particia Nearing, PA-C  naproxen (NAPROSYN) 500 MG tablet Take 1 tablet (500 mg total) by mouth 2 (two) times daily as needed. 11/22/20  Yes Particia Nearing, PA-C  albuterol (PROVENTIL HFA;VENTOLIN HFA) 108 (90 Base) MCG/ACT inhaler Inhale 1-2 puffs into the lungs every 6 (six) hours as  needed for wheezing or shortness of breath. 02/09/18   Hedges, Tinnie Gens, PA-C  Budesonide-Formoterol Fumarate (SYMBICORT IN) Inhale 2 puffs into the lungs in the morning and at bedtime.    [provider]  chlorhexidine (PERIDEX) 0.12 % solution Use as directed 15 mLs in the mouth or throat 2 (two) times daily.  Patient not taking: Reported on 09/24/2020 08/07/19   [provider]  fluticasone (FLONASE) 50 MCG/ACT nasal spray Place 1 spray into both nostrils daily.     [provider]  metroNIDAZOLE (FLAGYL) 500 MG tablet Take 1 tablet (500 mg total) by mouth 2 (two) times daily. 10/01/20   [name], MD  nortriptyline (PAMELOR) 10 MG capsule Take 1 capsule (10  mg total) by mouth at bedtime. 06/11/20   Lomax, Amy, NP  ondansetron (ZOFRAN-ODT) 4 MG disintegrating tablet Take 1-2 tablets (4-8 mg total) by mouth every 8 (eight) hours as needed for nausea. Or for vertigo Patient not taking: Reported on 09/24/2020 06/11/20   Lomax, Amy, NP  rizatriptan (MAXALT-MLT) 10 MG disintegrating tablet Take 1 tablet (10 mg total) by mouth as needed for migraine. May repeat in 2 hours if needed 06/11/20   Lomax, Amy, NP  SODIUM FLUORIDE 5000 PPM 1.1 % PSTE Take 1 application by mouth daily.  08/07/19   [provider]    Family History Family History  Problem Relation Age of Onset   Ovarian cancer Mother    Lung cancer Mother    Cervical cancer Mother    Depression Sister    Heart disease Maternal Grandmother    Arthritis Maternal Grandmother    Cancer Maternal Grandmother    Diabetes Maternal Grandmother    Hyperlipidemia Maternal Grandmother    COPD Maternal Grandfather    Arthritis Paternal Grandmother    Seizures Daughter    Heart defect Daughter    Diabetes Other    Cancer Other     Social History Social History   Tobacco Use   Smoking status: Never   Smokeless tobacco: Never  Vaping Use   Vaping Use: Never used  Substance Use Topics   Alcohol use: No    Alcohol/week: 0.0 standard drinks   Drug use: No     Allergies   Shrimp [shellfish allergy]   Review of Systems Review of Systems PER HPI   Physical Exam Triage Vital Signs ED Triage Vitals  Enc Vitals Group     BP 11/22/20 0953 114/83     Pulse Rate 11/22/20 0953 (!) 120     Resp 11/22/20 0953 18     Temp 11/22/20 0953 98.8 F (37.1 C)     Temp Source 11/22/20 0953 Oral     SpO2 11/22/20 0953 98 %     Weight --      Height --      Head Circumference --      Peak Flow --      Pain Score 11/22/20 1003 4     Pain Loc --      Pain Edu? --      Excl. in GC? --    No data found.  Updated Vital Signs BP 114/83   Pulse 62   Temp 98.8 F (37.1 C) (Oral)   Resp 18    LMP 11/01/2020   SpO2 98%   Visual Acuity Right Eye Distance:   Left Eye Distance:   Bilateral Distance:    Right Eye Near:   Left Eye Near:    Bilateral  Near:     Physical Exam Vitals and nursing note reviewed.  Constitutional:      Appearance: Normal appearance. She is not ill-appearing.  HENT:     Head: Atraumatic.     Mouth/Throat:     Mouth: Mucous membranes are moist.  Eyes:     Extraocular Movements: Extraocular movements intact.     Conjunctiva/sclera: Conjunctivae normal.  Cardiovascular:     Rate and Rhythm: Normal rate and regular rhythm.     Heart sounds: Normal heart sounds.  Pulmonary:     Effort: Pulmonary effort is normal. No respiratory distress.     Breath sounds: Normal breath sounds. No wheezing or rales.  Abdominal:     General: Bowel sounds are normal. There is no distension.     Palpations: Abdomen is soft.     Tenderness: There is no abdominal tenderness. There is no guarding.  Musculoskeletal:        General: Tenderness present. No swelling, deformity or signs of injury. Normal range of motion.     Cervical back: Normal range of motion and neck supple.     Comments: Mild ttp anterior right shoulder and lateral right pectoral insertion, slight exacerbation of pain with active ROM against resistance of RUE. Grip strength full and equal b/l UEs. ROM full and intact RUE  Skin:    General: Skin is warm and dry.     Findings: No erythema or rash.  Neurological:     General: No focal deficit present.     Mental Status: She is alert and oriented to person, place, and time.     Motor: No weakness.     Gait: Gait normal.     Comments: RUE neurovascularly intact  Psychiatric:        Mood and Affect: Mood normal.        Thought Content: Thought content normal.        Judgment: Judgment normal.     UC Treatments / Results  Labs (all labs ordered are listed, but only abnormal results are displayed) Labs Reviewed - No data to  display  EKG   Radiology No results found.  Procedures Procedures (including critical care time)  Medications Ordered in UC Medications - No data to display  Initial Impression / Assessment and Plan / UC Course  I have reviewed the triage vital signs and the nursing notes.  Pertinent labs & imaging results that were available during my care of the patient were reviewed by me and considered in my medical decision making (see chart for details).     Vital signs today benign and reassuring, EKG showing normal sinus rhythm at 62 bpm without ST elevations or T wave changes.  Her exam is also very reassuring with no neurologic deficits or obvious cardiopulmonary abnormalities.  Possibly muscular spasms causing radicular sharp pains.  We will treat with naproxen, Flexeril, warm compresses, stretches, rest.  Work note given.  Strict return precautions given for acutely worsening symptoms.   Final Clinical Impressions(s) / UC Diagnoses   Final diagnoses:  Right-sided chest wall pain  Acute pain of right shoulder   Discharge Instructions   None    ED Prescriptions     Medication Sig Dispense Auth. Provider   naproxen (NAPROSYN) 500 MG tablet Take 1 tablet (500 mg total) by mouth 2 (two) times daily as needed. 30 tablet Particia Nearing, [place]   cyclobenzaprine (FLEXERIL) 5 MG tablet Take 1 tablet (5 mg total) by mouth 3 (three) times  daily as needed for muscle spasms. Do not drink alcohol or drive while taking this medication.  May cause drowsiness. 30 tablet Particia Nearing, [place]      PDMP not reviewed this encounter.   Particia Nearing, [place] 11/22/20 1127

## 2020-12-04 ENCOUNTER — Other Ambulatory Visit: Payer: Self-pay | Admitting: Family Medicine

## 2020-12-10 ENCOUNTER — Ambulatory Visit: Payer: Medicaid Other | Admitting: Family Medicine

## 2020-12-24 ENCOUNTER — Inpatient Hospital Stay: Admission: RE | Admit: 2020-12-24 | Payer: Medicaid Other | Source: Ambulatory Visit

## 2021-02-06 ENCOUNTER — Encounter: Payer: Self-pay | Admitting: Gastroenterology

## 2021-02-21 ENCOUNTER — Encounter: Payer: Self-pay | Admitting: Gastroenterology

## 2021-02-21 ENCOUNTER — Other Ambulatory Visit (INDEPENDENT_AMBULATORY_CARE_PROVIDER_SITE_OTHER): Payer: Medicaid Other

## 2021-02-21 ENCOUNTER — Ambulatory Visit (INDEPENDENT_AMBULATORY_CARE_PROVIDER_SITE_OTHER): Payer: Medicaid Other | Admitting: Gastroenterology

## 2021-02-21 VITALS — BP 120/80 | HR 74 | Ht 68.0 in | Wt 149.8 lb

## 2021-02-21 DIAGNOSIS — K5900 Constipation, unspecified: Secondary | ICD-10-CM

## 2021-02-21 DIAGNOSIS — K642 Third degree hemorrhoids: Secondary | ICD-10-CM | POA: Diagnosis not present

## 2021-02-21 LAB — TSH: TSH: 1.74 u[IU]/mL (ref 0.35–5.50)

## 2021-02-21 NOTE — Patient Instructions (Signed)
If you are age 38 or older, your body mass index should be between 23-30. Your Body mass index is 22.78 kg/m. If this is out of the aforementioned range listed, please consider follow up with your Primary Care Provider.  If you are age 19 or younger, your body mass index should be between 19-25. Your Body mass index is 22.78 kg/m. If this is out of the aformentioned range listed, please consider follow up with your Primary Care Provider.   Your provider has requested that you go to the basement level for lab work before leaving today. Press "B" on the elevator. The lab is located at the first door on the left as you exit the elevator.  Start Miralax or Metamucil once daily.  The [facility] providers would like to encourage you to use [facility] to communicate with providers for non-urgent requests or questions.  Due to long hold times on the telephone, sending your provider a message by [facility] may be a faster and more efficient way to get a response.  Please allow 48 business hours for a response.  Please remember that this is for non-urgent requests.   It was a pleasure to see you today!  Thank you for trusting me with your gastrointestinal care!    Scott E.Tomasa Rand, MD

## 2021-02-21 NOTE — Progress Notes (Signed)
HPI : [name] is a pleasant 40 year old female with a history of asthma who is referred to [place] for further evaluation of constipation and hemorrhoids.  Patient states that she has had infrequent bowel movements most of her life.  Her stools are typically formed and soft.  Sometimes she has abdominal pain but not recently.  Diarrhea is not a recurring problem for her.  Sometimes she will go a week without a bowel movement, and very rarely has more than 2 bowel movements in a week.  She denies feeling uncomfortably full or bloated all the time.  She denies any nausea or vomiting.  She does have straining sometimes with bowel movements, and has symptoms consistent with grade 3 prolapsed hemorrhoids.  She states about 25% of the time, she will have to manually reduce prolapse hemorrhoids.  This is been going on over a year.  She denies blood in her stool. She reports decreased appetite over the past several years.  States that sometimes she will go a day without eating.  Her appetite fluctuates, but has not steadily decreased.  She denies dysphagia, heartburn/acid regurgitation, dyspepsia or early satiety. Her grandfather had colon cancer in his 64s.  Otherwise no family history of GI malignancy.  Her mother died from ovarian cancer 3 years ago, which is when she started having issues with her appetite.    Past Medical History:  Diagnosis Date   Allergy    Anemia    Asthma    GERD (gastroesophageal reflux disease)    Headache(784.0)    Heart murmur      Past Surgical History:  Procedure Laterality Date   CESAREAN SECTION  02/27/2005   CESAREAN SECTION N/A 02/08/2014   Procedure: REPEAT  CESAREAN SECTION;  Surgeon: Antionette Char, MD;  Location: [facility];  Service: Obstetrics;  Laterality: N/A;   CESAREAN SECTION  11/16/2001   Family History  Problem Relation Age of Onset   Ovarian cancer Mother    Lung cancer Mother    Cervical cancer Mother    Depression Sister    Heart disease  Maternal Grandmother    Arthritis Maternal Grandmother    Cancer Maternal Grandmother    Diabetes Maternal Grandmother    Hyperlipidemia Maternal Grandmother    COPD Maternal Grandfather    Arthritis Paternal Grandmother    Seizures Daughter    Heart defect Daughter    Diabetes Other    Cancer Other    Social History   Tobacco Use   Smoking status: Never   Smokeless tobacco: Never  Vaping Use   Vaping Use: Never used  Substance Use Topics   Alcohol use: No    Alcohol/week: 0.0 standard drinks   Drug use: No   Current Outpatient Medications  Medication Sig Dispense Refill   albuterol (PROVENTIL HFA;VENTOLIN HFA) 108 (90 Base) MCG/ACT inhaler Inhale 1-2 puffs into the lungs every 6 (six) hours as needed for wheezing or shortness of breath. 1 Inhaler 0   Budesonide-Formoterol Fumarate (SYMBICORT IN) Inhale 2 puffs into the lungs in the morning and at bedtime.     fluticasone (FLONASE) 50 MCG/ACT nasal spray Place 1 spray into both nostrils daily.      No current facility-administered medications for this visit.   Allergies  Allergen Reactions   Shrimp [Shellfish Allergy] Anaphylaxis     Review of Systems: All systems reviewed and negative except where noted in HPI.    No results found.  Physical Exam: BP 120/80    Pulse  74    Ht 5\' 8"  (1.727 m)    Wt 149 lb 12.8 oz (67.9 kg)    SpO2 98%    BMI 22.78 kg/m  Constitutional: Pleasant,well-developed, African-American female in no acute distress. HEENT: Normocephalic and atraumatic. Conjunctivae are normal. No scleral icterus. Cardiovascular: Normal rate, regular rhythm.  Pulmonary/chest: Effort normal and breath sounds normal. No wheezing, rales or rhonchi. Abdominal: Soft, nondistended, nontender. Bowel sounds active throughout. There are no masses palpable. No hepatomegaly. Extremities: no edema Neurological: Alert and oriented to person place and time. Skin: Skin is warm and dry. No rashes noted. Psychiatric: Normal  mood and affect. Behavior is normal.  CBC    Component Value Date/Time   WBC 3.9 09/24/2020 1622   WBC 4.1 04/25/2020 1336   RBC 4.00 09/24/2020 1622   RBC 4.14 04/25/2020 1336   HGB 11.4 09/24/2020 1622   HCT 36.3 09/24/2020 1622   PLT 142 (L) 09/24/2020 1622   MCV 91 09/24/2020 1622   MCH 28.5 09/24/2020 1622   MCH 29.2 04/25/2020 1336   MCHC 31.4 (L) 09/24/2020 1622   MCHC 32.2 04/25/2020 1336   RDW 14.3 09/24/2020 1622   LYMPHSABS 0.8 12/25/2019 1745   MONOABS 0.3 12/25/2019 1745   EOSABS 0.1 12/25/2019 1745   BASOSABS 0.0 12/25/2019 1745    CMP     Component Value Date/Time   NA 139 04/25/2020 1336   K 4.6 04/25/2020 1336   CL 106 04/25/2020 1336   CO2 25 04/25/2020 1336   GLUCOSE 86 04/25/2020 1336   BUN 10 04/25/2020 1336   CREATININE 0.80 04/25/2020 1336   CALCIUM 9.3 04/25/2020 1336   PROT 7.1 04/25/2020 1336   ALBUMIN 4.0 12/25/2019 1745   AST 20 04/25/2020 1336   ALT 16 04/25/2020 1336   ALKPHOS 40 12/25/2019 1745   BILITOT 0.7 04/25/2020 1336   GFRNONAA 94 04/25/2020 1336   GFRAA 109 04/25/2020 1336     ASSESSMENT AND PLAN: 40 year old female with longstanding constipation, as well as 1+ year history of grade 3 internal hemorrhoids and 3 years of decreased appetite with stable weight.  The patient sometimes only has 1 bowel movement a week.  She had been told by her primary doctor that she should have a bowel movement every day.  I reassured her that it is okay if she does not have a bowel movement every day, but she may feel better with having a few more bowel movements per week.  I recommended she try taking Metamucil on a daily basis.  In addition to helping provide more frequent bowel movements, this may also help reduce straining and her hemorrhoid symptoms.  She can undergo hemorrhoid banding, if fiber supplementation is not helpful.  We discussed potential pathophysiology of constipation to include idiopathic, pelvic floor and secondary constipation.   She has not had a thyroid checked recently, so we will exclude thyroid disorders as a potential cause of her symptoms.  Constipation - Metamucil daily - Check TSH  Hemorrhoids, third-degree - Metamucil daily - Banding if desired  Decreased appetite, no weight loss or upper GI symptoms -Concern for depression given onset of symptoms following death of her mother - Follow-up with PCM for further evaluation of depression  Tammy Ericsson E. 20, MD [facility]   CC:  Tomasa Rand, MD

## 2021-02-22 NOTE — Progress Notes (Signed)
Maya, please let Ms. Colegrove know that her thyroid test was normal.  Thank you

## 2022-01-02 ENCOUNTER — Ambulatory Visit: Payer: Medicaid Other | Admitting: Obstetrics

## 2022-04-08 ENCOUNTER — Ambulatory Visit (HOSPITAL_COMMUNITY)
Admission: EM | Admit: 2022-04-08 | Discharge: 2022-04-08 | Disposition: A | Discharge status: Home / Self Care | Payer: Medicaid Other | Attending: Family Medicine | Admitting: Family Medicine

## 2022-04-08 ENCOUNTER — Encounter (HOSPITAL_COMMUNITY): Payer: Self-pay | Admitting: *Deleted

## 2022-04-08 DIAGNOSIS — J069 Acute upper respiratory infection, unspecified: Secondary | ICD-10-CM

## 2022-04-08 MED ORDER — PROMETHAZINE-DM 6.25-15 MG/5ML PO SYRP: 5.0000 mL | ORAL_SOLUTION | Freq: Four times a day (QID) | ORAL | 0 refills | Status: DC | PRN

## 2022-04-08 NOTE — ED Provider Notes (Signed)
[facility]   [id number] 04/08/22 Arrival Time: 8416  ASSESSMENT & PLAN:  1. Viral URI with cough    Discussed typical duration of likely viral illness. No resp distress. Work note provided. OTC symptom care as needed.  New Prescriptions   PROMETHAZINE-DEXTROMETHORPHAN (PROMETHAZINE-DM) 6.25-15 MG/5ML SYRUP    Take 5 mLs by mouth 4 (four) times daily as needed for cough.     Follow-up Information     [facility]   Why: If worsening or failing to improve as anticipated. Contact information: [address] [place] [address] [phone]                 Reviewed expectations re: course of current medical issues. Questions answered. Outlined signs and symptoms indicating need for more acute intervention. Understanding verbalized. After Visit Summary given.   SUBJECTIVE: History from: Patient. [name] is a 42 y.o. female. Reports: fatigue and body aches; x 2 days; son with similar. Today she complains of sore throat, headache, coughing and congestion. She is taking her allergy meds PRN.  Denies: difficulty breathing. Normal PO intake without n/v/d.  OBJECTIVE:  Vitals:   04/08/22 1252  BP: 117/81  Pulse: 77  Resp: 18  Temp: 98.8 F (37.1 C)  TempSrc: Oral  SpO2: 98%    General appearance: alert; no distress Eyes: PERRLA; EOMI; conjunctiva normal HENT: [place]; AT; with nasal congestion Neck: supple  Lungs: speaks full sentences without difficulty; unlabored; CTAB Extremities: no edema Skin: warm and dry Neurologic: normal gait Psychological: alert and cooperative; normal mood and affect   Allergies  Allergen Reactions   Shrimp [Shellfish Allergy] Anaphylaxis    Past Medical History:  Diagnosis Date   Allergy    Anemia    Asthma    GERD (gastroesophageal reflux disease)    Headache(784.0)    Heart murmur    Social History   Socioeconomic History   Marital status: Married    Spouse name: Not on file    Number of children: 3   Years of education: Not on file   Highest education level: Associate degree: occupational, Hotel manager, or vocational program  Occupational History   Not on file  Tobacco Use   Smoking status: Never   Smokeless tobacco: Never  Vaping Use   Vaping Use: Never used  Substance and Sexual Activity   Alcohol use: No    Alcohol/week: 0.0 standard drinks of alcohol   Drug use: No   Sexual activity: Not Currently    Partners: Male    Birth control/protection: None  Other Topics Concern   Not on file  Social History Narrative   Lives at home with her family   Right handed   Caffeine: none    Social Determinants of Health   Financial Resource Strain: Not on file  Food Insecurity: Not on file  Transportation Needs: Not on file  Physical Activity: Not on file  Stress: Not on file  Social Connections: Not on file  Intimate Partner Violence: Not on file   Family History  Problem Relation Age of Onset   Ovarian cancer Mother    Lung cancer Mother    Cervical cancer Mother    Depression Sister    Heart disease Maternal Grandmother    Arthritis Maternal Grandmother    Cancer Maternal Grandmother    Diabetes Maternal Grandmother    Hyperlipidemia Maternal Grandmother    COPD Maternal Grandfather    Arthritis Paternal Grandmother    Seizures Daughter  Heart defect Daughter    Diabetes Other    Cancer Other    Past Surgical History:  Procedure Laterality Date   CESAREAN SECTION  02/27/2005   CESAREAN SECTION N/A 02/08/2014   Procedure: REPEAT  CESAREAN SECTION;  Surgeon: Lahoma Crocker, MD;  Location: [facility];  Service: Obstetrics;  Laterality: N/A;   CESAREAN SECTION  11/16/2001     Vanessa Kick, MD 04/08/22 [id number]

## 2022-04-08 NOTE — ED Triage Notes (Signed)
Pt states that Sunday she started feeling achy. Today she complains of sore throat, headache, coughing and congestion. She is taking her allergy meds PRN.

## 2022-05-27 ENCOUNTER — Encounter (HOSPITAL_COMMUNITY): Payer: Self-pay

## 2022-05-27 ENCOUNTER — Ambulatory Visit (HOSPITAL_COMMUNITY)
Admission: EM | Admit: 2022-05-27 | Discharge: 2022-05-27 | Disposition: A | Discharge status: Home / Self Care | Payer: Medicaid Other | Attending: Emergency Medicine | Admitting: Emergency Medicine

## 2022-05-27 DIAGNOSIS — J029 Acute pharyngitis, unspecified: Secondary | ICD-10-CM

## 2022-05-27 LAB — POCT RAPID STREP A, ED / UC: Streptococcus, Group A Screen (Direct): NEGATIVE

## 2022-05-27 MED ORDER — AMOXICILLIN 500 MG PO CAPS: 500.0000 mg | ORAL_CAPSULE | Freq: Two times a day (BID) | ORAL | 0 refills | Status: AC

## 2022-05-27 NOTE — Discharge Instructions (Signed)
Your rapid strep test today was negative however as all of your children have tested positive you will be prophylactically placed on antibiotic  Take amoxicillin twice a day for the next 10 days, daily will see improvement in about 48 hours and steady progression from there  To be use of salt gargles throat lozenges, warm liquids, teaspoons of honey and over-the-counter clippers septic spray for comfort  May give Tylenol or Motrin every 6 hours as needed for additional comfort  You may follow-up at urgent care as needed

## 2022-05-27 NOTE — ED Triage Notes (Signed)
Pt is here for sore throat, slight headache body aches, chills x 1day

## 2022-05-27 NOTE — ED Provider Notes (Signed)
[place]    CSN: [id number] Arrival date & time: 05/27/22  1800      History   Chief Complaint Chief Complaint  Patient presents with   Sore Throat   Headache    HPI [name] is a 42 y.o. female.   Patient presents for evaluation of chills, body aches, intermittent generalized headaches and sore throat beginning 1 day ago.  Tolerating food and liquids.  Has not attempted treatment.  Known exposure to strep.  Denies fevers, congestion, ear pain or cough.  History of asthma, seasonal allergies.  Past Medical History:  Diagnosis Date   Allergy    Anemia    Asthma    GERD (gastroesophageal reflux disease)    Headache(784.0)    Heart murmur     Patient Active Problem List   Diagnosis Date Noted   Vertigo 03/12/2020   Chronic migraine without aura without status migrainosus, not intractable 03/12/2020   S/P cesarean section 02/08/2014   [redacted] weeks gestation of pregnancy    Irregular contractions    Hereditary disease in family possibly affecting fetus, affecting management of mother, antepartum condition or complication    [redacted] weeks gestation of pregnancy    Previous cesarean delivery affecting pregnancy    [redacted] weeks gestation of pregnancy    Poor fetal growth affecting management of mother in third trimester, antepartum    Anemia during pregnancy 01/14/2014   Abdominal pain affecting pregnancy, antepartum    Non-reactive NST (non-stress test)    [redacted] weeks gestation of pregnancy    Vasa previa 12/16/2013   Abnormal EKG 09/05/2013   Hyperemesis complicating pregnancy, antepartum 09/05/2013   Nonspecific abnormal electrocardiogram (ECG) (EKG) 08/13/2013   Nausea and vomiting in pregnancy prior to [redacted] weeks gestation 08/13/2013    Past Surgical History:  Procedure Laterality Date   CESAREAN SECTION  02/27/2005   CESAREAN SECTION N/A 02/08/2014   Procedure: REPEAT  CESAREAN SECTION;  Surgeon: Lahoma Crocker, MD;  Location: [facility];  Service:  Obstetrics;  Laterality: N/A;   CESAREAN SECTION  11/16/2001    OB History     Gravida  4   Para  3   Term  2   Preterm  1   AB  1   Living  3      SAB      IAB  1   Ectopic      Multiple  0   Live Births  3            Home Medications    Prior to Admission medications   Medication Sig Start Date End Date Taking? Authorizing Provider  albuterol (PROVENTIL HFA;VENTOLIN HFA) 108 (90 Base) MCG/ACT inhaler Inhale 1-2 puffs into the lungs every 6 (six) hours as needed for wheezing or shortness of breath. 02/09/18  Yes Hedges, Dellis Filbert, PA-C  Budesonide-Formoterol Fumarate (SYMBICORT IN) Inhale 2 puffs into the lungs in the morning and at bedtime.   Yes [provider]  cetirizine (ZYRTEC) 10 MG tablet Take 10 mg by mouth daily as needed.   Yes [provider]  EPINEPHrine 0.3 mg/0.3 mL IJ SOAJ injection SMARTSIG:1 Pre-Filled Pen Syringe IM Once 03/20/22  Yes [provider]  fluticasone (FLONASE) 50 MCG/ACT nasal spray Place 1 spray into both nostrils daily.    Yes [provider]  rizatriptan (MAXALT-MLT) 10 MG disintegrating tablet SMARTSIG:1 Tablet(s) By Mouth 1-2 Times Daily 02/26/22  Yes [provider]  Vitamin D, Ergocalciferol, (DRISDOL) 1.25 MG (50000 UNIT)  CAPS capsule Take 50,000 Units by mouth once a week. 04/02/22  Yes [provider]  promethazine-dextromethorphan (PROMETHAZINE-DM) 6.25-15 MG/5ML syrup Take 5 mLs by mouth 4 (four) times daily as needed for cough. 04/08/22   Vanessa Kick, MD  sertraline (ZOLOFT) 25 MG tablet SMARTSIG:1 Tablet(s) By Mouth Every Evening 03/20/22   [provider]    Family History Family History  Problem Relation Age of Onset   Ovarian cancer Mother    Lung cancer Mother    Cervical cancer Mother    Depression Sister    Heart disease Maternal Grandmother    Arthritis Maternal Grandmother    Cancer Maternal Grandmother    Diabetes Maternal Grandmother     Hyperlipidemia Maternal Grandmother    COPD Maternal Grandfather    Arthritis Paternal Grandmother    Seizures Daughter    Heart defect Daughter    Diabetes Other    Cancer Other     Social History Social History   Tobacco Use   Smoking status: Never   Smokeless tobacco: Never  Vaping Use   Vaping Use: Never used  Substance Use Topics   Alcohol use: No    Alcohol/week: 0.0 standard drinks of alcohol   Drug use: No     Allergies   Shellfish allergy   Review of Systems Review of Systems  Constitutional:  Positive for chills. Negative for activity change, appetite change, diaphoresis, fatigue, fever and unexpected weight change.  HENT:  Positive for sore throat. Negative for congestion, dental problem, drooling, ear discharge, ear pain, facial swelling, hearing loss, mouth sores, nosebleeds, postnasal drip, rhinorrhea, sinus pressure, sinus pain, sneezing, tinnitus, trouble swallowing and voice change.   Respiratory: Negative.    Cardiovascular: Negative.   Gastrointestinal: Negative.   Musculoskeletal:  Positive for myalgias. Negative for arthralgias, back pain, gait problem, joint swelling, neck pain and neck stiffness.  Skin: Negative.   Neurological:  Positive for headaches. Negative for dizziness, tremors, seizures, syncope, facial asymmetry, speech difficulty, weakness, light-headedness and numbness.     Physical Exam Triage Vital Signs ED Triage Vitals [05/27/22 1842]  Enc Vitals Group     BP (!) 128/91     Pulse Rate 86     Resp 12     Temp 98.6 F (37 C)     Temp Source Oral     SpO2 98 %     Weight      Height      Head Circumference      Peak Flow      Pain Score      Pain Loc      Pain Edu?      Excl. in [place]?    No data found.  Updated Vital Signs BP (!) 128/91 (BP Location: Right Arm)   Pulse 86   Temp 98.6 F (37 C) (Oral)   Resp 12   SpO2 98%   Visual Acuity Right Eye Distance:   Left Eye Distance:   Bilateral Distance:    Right  Eye Near:   Left Eye Near:    Bilateral Near:     Physical Exam Constitutional:      Appearance: Normal appearance.  HENT:     Right Ear: Tympanic membrane, ear canal and external ear normal.     Left Ear: Tympanic membrane, ear canal and external ear normal.     Nose: Nose normal.     Mouth/Throat:     Mouth: Mucous membranes are moist.  Pharynx: No posterior oropharyngeal erythema.  Cardiovascular:     Rate and Rhythm: Normal rate and regular rhythm.     Pulses: Normal pulses.     Heart sounds: Normal heart sounds.  Pulmonary:     Effort: Pulmonary effort is normal.     Breath sounds: Normal breath sounds.  Lymphadenopathy:     Cervical: Cervical adenopathy present.  Skin:    General: Skin is warm and dry.  Neurological:     Mental Status: She is alert and oriented to person, place, and time. Mental status is at baseline.      UC Treatments / Results  Labs (all labs ordered are listed, but only abnormal results are displayed) Labs Reviewed - No data to display  EKG   Radiology No results found.  Procedures Procedures (including critical care time)  Medications Ordered in UC Medications - No data to display  Initial Impression / Assessment and Plan / UC Course  I have reviewed the triage vital signs and the nursing notes.  Pertinent labs & imaging results that were available during my care of the patient were reviewed by me and considered in my medical decision making (see chart for details).  Sore throat  Vital signs stable patient is in no signs of distress nontoxic-appearing, rapid strep test is negative however all 3 of her children have tested positive and therefore she will be prophylactically placed on antibiotics, amoxicillin prescribed and recommended additional supportive care, may follow-up with his urgent care as needed Final Clinical Impressions(s) / UC Diagnoses   Final diagnoses:  None   Discharge Instructions   None    ED  Prescriptions   None    PDMP not reviewed this encounter.   Hans Eden, NP 05/27/22 1929

## 2023-01-05 ENCOUNTER — Other Ambulatory Visit: Payer: Self-pay | Admitting: Internal Medicine

## 2023-01-05 DIAGNOSIS — Z1231 Encounter for screening mammogram for malignant neoplasm of breast: Secondary | ICD-10-CM

## 2023-02-02 ENCOUNTER — Ambulatory Visit
Admission: RE | Admit: 2023-02-02 | Discharge: 2023-02-02 | Disposition: A | Discharge status: Home / Self Care | Payer: Medicaid Other | Source: Ambulatory Visit | Attending: Internal Medicine | Admitting: Internal Medicine

## 2023-02-02 DIAGNOSIS — Z1231 Encounter for screening mammogram for malignant neoplasm of breast: Secondary | ICD-10-CM

## 2023-05-04 ENCOUNTER — Ambulatory Visit: Payer: Medicaid Other | Admitting: Obstetrics and Gynecology

## 2023-05-04 ENCOUNTER — Encounter: Payer: Self-pay | Admitting: Obstetrics and Gynecology

## 2023-05-04 ENCOUNTER — Other Ambulatory Visit (HOSPITAL_COMMUNITY)
Admission: RE | Admit: 2023-05-04 | Discharge: 2023-05-04 | Disposition: A | Discharge status: Home / Self Care | Source: Ambulatory Visit | Attending: Obstetrics and Gynecology | Admitting: Obstetrics and Gynecology

## 2023-05-04 VITALS — BP 117/82 | HR 85 | Wt 150.9 lb

## 2023-05-04 DIAGNOSIS — Z01419 Encounter for gynecological examination (general) (routine) without abnormal findings: Secondary | ICD-10-CM

## 2023-05-04 NOTE — Progress Notes (Signed)
 Subjective:     [name] is a 43 y.o. female P3 with LMP 04/27/23 who is here for a comprehensive physical exam. The patient reports no problems. She reports a monthly period lasting 3-7 days, heavy in flow. Patient states menorrhagia started approximately a year ago. She has not been sexually active in 6 years. She denies pelvic pain or abnormal discharge. She denies urinary incontinence.   Past Medical History:  Diagnosis Date   Allergy    Anemia    Asthma    GERD (gastroesophageal reflux disease)    Headache(784.0)    Heart murmur    Past Surgical History:  Procedure Laterality Date   CESAREAN SECTION  02/27/2005   CESAREAN SECTION N/A 02/08/2014   Procedure: REPEAT  CESAREAN SECTION;  Surgeon: Antionette Char, MD;  Location: [facility];  Service: Obstetrics;  Laterality: N/A;   CESAREAN SECTION  11/16/2001   Family History  Problem Relation Age of Onset   Ovarian cancer Mother    Lung cancer Mother    Cervical cancer Mother    Depression Sister    Seizures Daughter    Heart defect Daughter    Breast cancer Paternal Aunt    Heart disease Maternal Grandmother    Arthritis Maternal Grandmother    Cancer Maternal Grandmother    Diabetes Maternal Grandmother    Hyperlipidemia Maternal Grandmother    COPD Maternal Grandfather    Arthritis Paternal Grandmother    Diabetes Other    Cancer Other    Social History   Socioeconomic History   Marital status: Married    Spouse name: Not on file   Number of children: 3   Years of education: Not on file   Highest education level: Associate degree: occupational, Scientist, product/process development, or vocational program  Occupational History   Not on file  Tobacco Use   Smoking status: Never   Smokeless tobacco: Never  Vaping Use   Vaping status: Never Used  Substance and Sexual Activity   Alcohol use: No    Alcohol/week: 0.0 standard drinks of alcohol   Drug use: No   Sexual activity: Not Currently    Partners: Male    Birth  control/protection: None  Other Topics Concern   Not on file  Social History Narrative   Lives at home with her family   Right handed   Caffeine: none    Social Drivers of Corporate investment banker Strain: Not on file  Food Insecurity: Not on file  Transportation Needs: Not on file  Physical Activity: Not on file  Stress: Not on file  Social Connections: Not on file  Intimate Partner Violence: Not on file   Health Maintenance  Topic Date Due   INFLUENZA VACCINE  10/09/2022   COVID-19 Vaccine (1 - 2024-25 season) Never done   DTaP/Tdap/Td (2 - Td or Tdap) 12/30/2023   Cervical Cancer Screening (HPV/Pap Cotest)  09/24/2025   Hepatitis C Screening  Completed   HIV Screening  Completed   HPV VACCINES  Aged Out       Review of Systems Pertinent items noted in HPI and remainder of comprehensive ROS otherwise negative.   Objective:  Blood pressure 117/82, pulse 85, weight 150 lb 14.4 oz (68.4 kg), last menstrual period 04/27/2023.   GENERAL: Well-developed, well-nourished female in no acute distress.  HEENT: Normocephalic, atraumatic. Sclerae anicteric.  NECK: Supple. Normal thyroid.  LUNGS: Clear to auscultation bilaterally.  HEART: Regular rate and rhythm. BREASTS: Symmetric in size. No palpable masses or  lymphadenopathy, skin changes, or nipple drainage. ABDOMEN: Soft, nontender, nondistended. No organomegaly. PELVIC: Normal external female genitalia. Vagina is pink and rugated.  Normal discharge. Normal appearing cervix. Uterus is normal in size. No adnexal mass or tenderness. Chaperone present during the pelvic exam EXTREMITIES: No cyanosis, clubbing, or edema, 2+ distal pulses.     Assessment:    Healthy female exam.      Plan:    Pap smear collected Vaginal swab to rule out vaginitis Health maintenance labs Pelvic ultrasound ordered to rule out fibroid uterus Patient with normal mammogram 01/2023 Patient will be contacted with abnormal results See After  Visit Summary for Counseling Recommendations

## 2023-05-04 NOTE — Progress Notes (Signed)
 Pt presents for AEX. Pap smear today. Pt requests CBC. Declines STD testing. Does want swab for BV and yeast.

## 2023-05-05 LAB — COMPREHENSIVE METABOLIC PANEL
ALT: 12 IU/L (ref 0–32)
AST: 21 IU/L (ref 0–40)
Albumin: 4.2 g/dL (ref 3.9–4.9)
Alkaline Phosphatase: 54 IU/L (ref 44–121)
BUN/Creatinine Ratio: 9 (ref 9–23)
BUN: 7 mg/dL (ref 6–24)
Bilirubin Total: 1 mg/dL (ref 0.0–1.2)
CO2: 23 mmol/L (ref 20–29)
Calcium: 8.8 mg/dL (ref 8.7–10.2)
Chloride: 102 mmol/L (ref 96–106)
Creatinine, Ser: 0.74 mg/dL (ref 0.57–1.00)
Globulin, Total: 2.5 g/dL (ref 1.5–4.5)
Glucose: 72 mg/dL (ref 70–99)
Potassium: 3.8 mmol/L (ref 3.5–5.2)
Sodium: 137 mmol/L (ref 134–144)
Total Protein: 6.7 g/dL (ref 6.0–8.5)
eGFR: 105 mL/min/{1.73_m2} (ref >59)

## 2023-05-05 LAB — CBC
Hematocrit: 35 % (ref 34.0–46.6)
Hemoglobin: 11.1 g/dL (ref 11.1–15.9)
MCH: 29.4 pg (ref 26.6–33.0)
MCHC: 31.7 g/dL (ref 31.5–35.7)
MCV: 93 fL (ref 79–97)
Platelets: 157 10*3/uL (ref 150–450)
RBC: 3.77 x10E6/uL (ref 3.77–5.28)
RDW: 13.6 % (ref 11.7–15.4)
WBC: 4.2 10*3/uL (ref 3.4–10.8)

## 2023-05-05 LAB — HEMOGLOBIN A1C
Est. average glucose Bld gHb Est-mCnc: 108 mg/dL
Hgb A1c MFr Bld: 5.4 % (ref 4.8–5.6)

## 2023-05-05 LAB — TSH: TSH: 1.4 u[IU]/mL (ref 0.450–4.500)

## 2023-05-06 LAB — CERVICOVAGINAL ANCILLARY ONLY
Bacterial Vaginitis (gardnerella): POSITIVE — AB
Candida Glabrata: NEGATIVE
Candida Vaginitis: NEGATIVE
Chlamydia: NEGATIVE
Comment: NEGATIVE
Comment: NEGATIVE
Comment: NEGATIVE
Comment: NEGATIVE
Comment: NEGATIVE
Comment: NORMAL
Neisseria Gonorrhea: NEGATIVE
Trichomonas: NEGATIVE

## 2023-05-07 ENCOUNTER — Encounter: Payer: Self-pay | Admitting: Obstetrics and Gynecology

## 2023-05-07 LAB — CYTOLOGY - PAP
Comment: NEGATIVE
Diagnosis: NEGATIVE
High risk HPV: NEGATIVE

## 2023-05-07 MED ORDER — NUVESSA 1.3 % VA GEL: 1.0000 | Freq: Every evening | VAGINAL | 0 refills | Status: AC

## 2023-05-07 NOTE — Addendum Note (Signed)
 Addended by: Catalina Antigua on: 05/07/2023 12:33 PM   Modules accepted: Orders

## 2023-05-08 ENCOUNTER — Ambulatory Visit (HOSPITAL_BASED_OUTPATIENT_CLINIC_OR_DEPARTMENT_OTHER): Payer: Medicaid Other

## 2023-05-15 ENCOUNTER — Ambulatory Visit (HOSPITAL_BASED_OUTPATIENT_CLINIC_OR_DEPARTMENT_OTHER): Payer: Medicaid Other

## 2023-06-27 ENCOUNTER — Ambulatory Visit (HOSPITAL_BASED_OUTPATIENT_CLINIC_OR_DEPARTMENT_OTHER)
Admission: RE | Admit: 2023-06-27 | Discharge: 2023-06-27 | Disposition: A | Discharge status: Home / Self Care | Source: Ambulatory Visit | Attending: Obstetrics and Gynecology | Admitting: Obstetrics and Gynecology

## 2023-06-27 DIAGNOSIS — Z01419 Encounter for gynecological examination (general) (routine) without abnormal findings: Secondary | ICD-10-CM

## 2023-07-03 ENCOUNTER — Ambulatory Visit (HOSPITAL_BASED_OUTPATIENT_CLINIC_OR_DEPARTMENT_OTHER)
Admission: RE | Admit: 2023-07-03 | Discharge: 2023-07-03 | Disposition: A | Discharge status: Home / Self Care | Source: Ambulatory Visit | Attending: Obstetrics and Gynecology | Admitting: Obstetrics and Gynecology

## 2023-07-03 DIAGNOSIS — Z01419 Encounter for gynecological examination (general) (routine) without abnormal findings: Secondary | ICD-10-CM | POA: Diagnosis present

## 2023-07-06 ENCOUNTER — Encounter: Payer: Self-pay | Admitting: Obstetrics and Gynecology

## 2023-07-08 ENCOUNTER — Other Ambulatory Visit: Payer: Self-pay | Admitting: Obstetrics and Gynecology

## 2023-07-08 DIAGNOSIS — Z01419 Encounter for gynecological examination (general) (routine) without abnormal findings: Secondary | ICD-10-CM

## 2023-08-14 ENCOUNTER — Other Ambulatory Visit: Payer: Self-pay | Admitting: Obstetrics and Gynecology

## 2023-08-16 NOTE — Progress Notes (Deleted)
 08/16/2023 [name] [id number] 1980/03/13   Chief Complaint: Abdominal bloat, gas   History of Present Illness: [name] is a 43 year old female with a past medical history of asthma, anemia and GERD. She was initially seen in office by Dr. Cherryl Corona 02/21/2021 due to having a decreased appetite, constipation and intermittent prolapsed hemorrhoids without bleeding. Grandfather had colon caner in his 51s. Mother had ovarian cancer. She was instructed to take Metamucil and to consider hemorrhoid banding. Decreased appetite was thought to be secondary to grieving after her mother died 3 years ago and she was referred to her PCP to consider evaluation for possible depression.      Latest Ref Rng & Units 05/04/2023    1:40 PM 09/24/2020    4:22 PM 04/25/2020    1:36 PM  CBC  WBC 3.4 - 10.8 x10E3/uL 4.2  3.9  4.1   Hemoglobin 11.1 - 15.9 g/dL 40.9  81.1  91.4   Hematocrit 34.0 - 46.6 % 35.0  36.3  37.6   Platelets 150 - 450 x10E3/uL 157  142  148        Latest Ref Rng & Units 05/04/2023    1:40 PM 04/25/2020    1:36 PM 12/25/2019    5:45 PM  CMP  Glucose 70 - 99 mg/dL 72  86  95   BUN 6 - 24 mg/dL 7  10  8    Creatinine 0.57 - 1.00 mg/dL 7.82  9.56  2.13   Sodium 134 - 144 mmol/L 137  139  139   Potassium 3.5 - 5.2 mmol/L 3.8  4.6  3.7   Chloride 96 - 106 mmol/L 102  106  103   CO2 20 - 29 mmol/L 23  25  26    Calcium  8.7 - 10.2 mg/dL 8.8  9.3  9.3   Total Protein 6.0 - 8.5 g/dL 6.7  7.1  7.7   Total Bilirubin 0.0 - 1.2 mg/dL 1.0  0.7  1.1   Alkaline Phos 44 - 121 IU/L 54   40   AST 0 - 40 IU/L 21  20  24    ALT 0 - 32 IU/L 12  16  24     TSH 1.4   Pelvic sonogram 07/06/2023: FINDINGS: Uterus   Measurements: 8.4 x 4.1 x 5.1 cm = volume: 90.4 mL. 1 x 0.9 x 1 cm posterior fundal uterine fibroid noted.   Endometrium   Thickness: 4 mm.  No focal abnormality visualized.   Right ovary   Measurements: 4 x 1.9 x 2.6 cm = volume: 10.4 mL. 2.3 x 1.7 x 2.5 cm complex  cyst noted in the right ovary.   Left ovary   Measurements: 2.6 x 1.4 x 1.3 cm = volume: 2.4 mL. Normal appearance/no adnexal mass.   Other findings   Trace free fluid noted.   IMPRESSION: 1. 1 cm posterior fundal uterine fibroid. 2. 2.5 cm complex cyst noted in the right ovary, likely a hemorrhagic cyst. Follow-up in 6 weeks is recommended.      Current Medications, Allergies, Past Medical History, Past Surgical History, Family History and Social History were reviewed in [employer] record.   Review of Systems:   Constitutional: Negative for fever, sweats, chills or weight loss.  Respiratory: Negative for shortness of breath.   Cardiovascular: Negative for chest pain, palpitations and leg swelling.  Gastrointestinal: See HPI.  Musculoskeletal: Negative for back pain or muscle aches.  Neurological: Negative for dizziness, headaches  or paresthesias.    Physical Exam: There were no vitals taken for this visit. General: in no acute distress. Head: Normocephalic and atraumatic. Eyes: No scleral icterus. Conjunctiva pink . Ears: Normal auditory acuity. Mouth: Dentition intact. No ulcers or lesions.  Lungs: Clear throughout to auscultation. Heart: Regular rate and rhythm, no murmur. Abdomen: Soft, nontender and nondistended. No masses or hepatomegaly. Normal bowel sounds x 4 quadrants.  Rectal: Deferred.  Musculoskeletal: Symmetrical with no gross deformities. Extremities: No edema. Neurological: Alert oriented x 4. No focal deficits.  Psychological: Alert and cooperative. Normal mood and affect  Assessment and Recommendations: ***

## 2023-08-17 ENCOUNTER — Ambulatory Visit: Admitting: Nurse Practitioner

## 2023-11-20 ENCOUNTER — Other Ambulatory Visit (INDEPENDENT_AMBULATORY_CARE_PROVIDER_SITE_OTHER)

## 2023-11-20 ENCOUNTER — Ambulatory Visit: Admitting: Nurse Practitioner

## 2023-11-20 ENCOUNTER — Encounter: Payer: Self-pay | Admitting: Nurse Practitioner

## 2023-11-20 VITALS — BP 102/66 | HR 64 | Ht 68.0 in | Wt 161.2 lb

## 2023-11-20 DIAGNOSIS — R1084 Generalized abdominal pain: Secondary | ICD-10-CM

## 2023-11-20 DIAGNOSIS — K5900 Constipation, unspecified: Secondary | ICD-10-CM | POA: Diagnosis not present

## 2023-11-20 DIAGNOSIS — K5909 Other constipation: Secondary | ICD-10-CM

## 2023-11-20 LAB — COMPREHENSIVE METABOLIC PANEL WITH GFR
ALT: 14 U/L (ref 0–35)
AST: 21 U/L (ref 0–37)
Albumin: 4.3 g/dL (ref 3.5–5.2)
Alkaline Phosphatase: 48 U/L (ref 39–117)
BUN: 9 mg/dL (ref 6–23)
CO2: 28 meq/L (ref 19–32)
Calcium: 9 mg/dL (ref 8.4–10.5)
Chloride: 105 meq/L (ref 96–112)
Creatinine, Ser: 0.81 mg/dL (ref 0.40–1.20)
GFR: 90.2 mL/min (ref >60.00)
Glucose, Bld: 88 mg/dL (ref 70–99)
Potassium: 3.6 meq/L (ref 3.5–5.1)
Sodium: 138 meq/L (ref 135–145)
Total Bilirubin: 0.9 mg/dL (ref 0.2–1.2)
Total Protein: 7.3 g/dL (ref 6.0–8.3)

## 2023-11-20 LAB — CBC WITH DIFFERENTIAL/PLATELET
Basophils Absolute: 0 K/uL (ref 0.0–0.1)
Basophils Relative: 0.9 % (ref 0.0–3.0)
Eosinophils Absolute: 0.1 K/uL (ref 0.0–0.7)
Eosinophils Relative: 1.8 % (ref 0.0–5.0)
HCT: 34.3 % — ABNORMAL LOW (ref 36.0–46.0)
Hemoglobin: 11.3 g/dL — ABNORMAL LOW (ref 12.0–15.0)
Lymphocytes Relative: 28.7 % (ref 12.0–46.0)
Lymphs Abs: 1.5 K/uL (ref 0.7–4.0)
MCHC: 33 g/dL (ref 30.0–36.0)
MCV: 88.1 fl (ref 78.0–100.0)
Monocytes Absolute: 0.4 K/uL (ref 0.1–1.0)
Monocytes Relative: 7 % (ref 3.0–12.0)
Neutro Abs: 3.2 K/uL (ref 1.4–7.7)
Neutrophils Relative %: 61.6 % (ref 43.0–77.0)
Platelets: 137 K/uL — ABNORMAL LOW (ref 150.0–400.0)
RBC: 3.89 Mil/uL (ref 3.87–5.11)
RDW: 15.6 % — ABNORMAL HIGH (ref 11.5–15.5)
WBC: 5.3 K/uL (ref 4.0–10.5)

## 2023-11-20 LAB — C-REACTIVE PROTEIN: CRP: <1 mg/dL (ref 0.5–20.0)

## 2023-11-20 LAB — SEDIMENTATION RATE: Sed Rate: 3 mm/h (ref 0–20)

## 2023-11-20 MED ORDER — DICYCLOMINE HCL 10 MG PO CAPS: 10.0000 mg | ORAL_CAPSULE | Freq: Three times a day (TID) | ORAL | 1 refills | Status: AC | PRN

## 2023-11-20 NOTE — Progress Notes (Unsigned)
     11/20/2023 [name] [id number] Nov 28, 1980   Chief Complaint:  History of Present Illness: [name] is a pleasant 43 year old female with a history of asthma who is referred to us  for further evaluation of constipation and hemorrhoids. She is known by Dr. Stacia.   She has abdominal pain which started one week ago. She though she was constipatied, passed pebble like stool. Then generalized twisting pain. Passed a little gas with brief relief. Feels abdomen swells up, filling up with air. Not constant. Comes and goes. Two days ago was worse.   Today less pain.   Stopped eating  Ate apples  banana andgrilled chicken salad sandwhich  Small bubbles and sometimes big bubbles.   She has a BM twice weekly,  since young adult.  She macha, spoiled child for skin, collagen.      Past Medical History:  Diagnosis Date   Allergy    Anemia    Asthma    GERD (gastroesophageal reflux disease)    Headache(784.0)    Heart murmur    Past Surgical History:  Procedure Laterality Date   CESAREAN SECTION  02/27/2005   CESAREAN SECTION N/A 02/08/2014   Procedure: REPEAT  CESAREAN SECTION;  Surgeon: Olam Mill, MD;  Location: [facility];  Service: Obstetrics;  Laterality: N/A;   CESAREAN SECTION  11/16/2001      Current Medications, Allergies, Past Medical History, Past Surgical History, Family History and Social History were reviewed in [employer] record.   Review of Systems:   Constitutional: Negative for fever, sweats, chills or weight loss.  Respiratory: Negative for shortness of breath.   Cardiovascular: Negative for chest pain, palpitations and leg swelling.  Gastrointestinal: See HPI.  Musculoskeletal: Negative for back pain or muscle aches.  Neurological: Negative for dizziness, headaches or paresthesias.    Physical Exam: BP 102/66   Pulse 64   Ht 5' 8 (1.727 m)   Wt 161 lb 4 oz (73.1 kg)   SpO2 92%   BMI 24.52 kg/m   General: in no acute distress. Head: Normocephalic and atraumatic. Eyes: No scleral icterus. Conjunctiva pink . Ears: Normal auditory acuity. Mouth: Dentition intact. No ulcers or lesions.  Lungs: Clear throughout to auscultation. Heart: Regular rate and rhythm, no murmur. Abdomen: Soft, nontender and nondistended. No masses or hepatomegaly. Normal bowel sounds x 4 quadrants.  Rectal: Deferred.  Musculoskeletal: Symmetrical with no gross deformities. Extremities: No edema. Neurological: Alert oriented x 4. No focal deficits.  Psychological: Alert and cooperative. Normal mood and affect  Assessment and Recommendations: ***

## 2023-11-20 NOTE — Patient Instructions (Addendum)
 You have been scheduled for a CT scan of the abdomen and pelvis at [facility], 1st floor Radiology. You are scheduled on 12/01/23 at 4:00 pm. You should arrive 2 hours prior @ 2:00 pm  to your appointment time for registration and to drink oral contrast .   Please follow the written instructions below on the day of your exam:   1) Do not eat anything after 12:00 noon  (4 hours prior to your test)     You may take any medications as prescribed with a small amount of water, if necessary. If you take any of the following medications: METFORMIN, GLUCOPHAGE, GLUCOVANCE, AVANDAMET, RIOMET, FORTAMET, ACTOPLUS MET, JANUMET, GLUMETZA or METAGLIP, you MAY be asked to HOLD this medication 48 hours AFTER the exam.   The purpose of you drinking the oral contrast is to aid in the visualization of your intestinal tract. The contrast solution may cause some diarrhea. Depending on your individual set of symptoms, you may also receive an intravenous injection of x-ray contrast/dye. Plan on being at [facility] for 45 minutes or longer, depending on the type of exam you are having performed.   If you have any questions regarding your exam or if you need to reschedule, you may call [facility] at [phone] between the hours of 8:00 am and 5:00 pm, Monday-Friday.   Your provider has requested that you go to the basement level for lab work before leaving today. Press B on the elevator. The lab is located at the first door on the left as you exit the elevator.  Please purchase the following medications over the counter and take as directed: Miralax   - 1 capful at bedtime to increase output of stool.  Drink at least 647 oz of water daily.  We have sent the following medications to your pharmacy for you to pick up at your convenience: Dicyclomine   Due to recent changes in healthcare laws, you may see the results of your imaging and laboratory studies on MyChart before your provider has had a  chance to review them.  We understand that in some cases there may be results that are confusing or concerning to you. Not all laboratory results come back in the same time frame and the provider may be waiting for multiple results in order to interpret others.  Please give us  48 hours in order for your provider to thoroughly review all the results before contacting the office for clarification of your results.    Thank you for trusting me with your gastrointestinal care!   Elida Shawl, CRNP

## 2023-11-21 LAB — IGA: Immunoglobulin A: 207 mg/dL (ref 47–310)

## 2023-11-21 LAB — TISSUE TRANSGLUTAMINASE, IGA: (tTG) Ab, IgA: <1 U/mL

## 2023-11-22 ENCOUNTER — Ambulatory Visit: Payer: Self-pay | Admitting: Nurse Practitioner

## 2023-12-01 ENCOUNTER — Ambulatory Visit (HOSPITAL_COMMUNITY)

## 2023-12-03 ENCOUNTER — Ambulatory Visit (HOSPITAL_COMMUNITY)
Admission: RE | Admit: 2023-12-03 | Discharge: 2023-12-03 | Disposition: A | Discharge status: Home / Self Care | Source: Ambulatory Visit | Attending: Nurse Practitioner | Admitting: Nurse Practitioner

## 2023-12-03 DIAGNOSIS — K5900 Constipation, unspecified: Secondary | ICD-10-CM | POA: Diagnosis present

## 2023-12-03 DIAGNOSIS — R1084 Generalized abdominal pain: Secondary | ICD-10-CM | POA: Diagnosis present

## 2023-12-03 MED ORDER — IOHEXOL 9 MG/ML PO SOLN
ORAL | Status: AC
  Filled 2023-12-03: qty 1000

## 2023-12-03 MED ORDER — IOHEXOL 300 MG/ML  SOLN
100.0000 mL | Freq: Once | INTRAMUSCULAR | Status: AC | PRN
  Administered 2023-12-03: 100 mL via INTRAVENOUS

## 2023-12-03 MED ORDER — IOHEXOL 9 MG/ML PO SOLN
1000.0000 mL | ORAL | Status: AC
  Administered 2023-12-03: 1000 mL via ORAL

## 2023-12-03 NOTE — Progress Notes (Signed)
 Agree with the assessment and plan as outlined by Alcide Evener, NP.    Zae Kirtz E. Tomasa Rand, MD [facility]

## 2023-12-07 ENCOUNTER — Encounter: Payer: Self-pay | Admitting: Obstetrics and Gynecology

## 2024-01-21 ENCOUNTER — Ambulatory Visit: Admitting: Nurse Practitioner

## 2024-03-08 ENCOUNTER — Other Ambulatory Visit: Payer: Self-pay | Admitting: Obstetrics and Gynecology

## 2024-03-08 DIAGNOSIS — N83201 Unspecified ovarian cyst, right side: Secondary | ICD-10-CM
